# Patient Record
Sex: Female | Born: 1960 | Race: White | Hispanic: No | Marital: Married
Health system: Midwestern US, Community
[De-identification: ages and names within clinical notes are randomized; demographics above are authoritative.]

---

## 2017-07-15 ENCOUNTER — Encounter: Attending: Sports Medicine" | Primary: Family

## 2017-07-15 ENCOUNTER — Inpatient Hospital Stay: Payer: MEDICARE | Primary: Family

## 2017-07-15 ENCOUNTER — Ambulatory Visit: Admit: 2017-07-15 | Discharge: 2017-07-15 | Payer: MEDICARE | Attending: Sports Medicine" | Primary: Family

## 2017-07-15 DIAGNOSIS — M545 Low back pain, unspecified: Secondary | ICD-10-CM

## 2017-07-15 DIAGNOSIS — G35 Multiple sclerosis: Secondary | ICD-10-CM

## 2017-07-15 LAB — COMPREHENSIVE METABOLIC PANEL WITH BILIRUBIN
ALT: 14 U/L (ref 5–33)
AST: 19 U/L (ref <32)
Albumin/Globulin Ratio: 1.3 (ref 1.0–2.5)
Albumin: 4.2 g/dL (ref 3.5–5.2)
Alkaline Phosphatase: 61 U/L (ref 35–104)
Anion Gap: 13 mmol/L (ref 9–17)
BUN: 8 mg/dL (ref 6–20)
Bilirubin, Direct: <0.08 mg/dL (ref <0.31)
CO2: 26 mmol/L (ref 20–31)
Calcium: 10 mg/dL (ref 8.6–10.4)
Chloride: 100 mmol/L (ref 98–107)
Creatinine: 0.64 mg/dL (ref 0.50–0.90)
GFR African American: >60 mL/min (ref >60)
GFR Non-African American: >60 mL/min (ref >60)
Glucose: 91 mg/dL (ref 70–99)
Potassium: 4.7 mmol/L (ref 3.7–5.3)
Sodium: 139 mmol/L (ref 135–144)
Total Bilirubin: 0.26 mg/dL — ABNORMAL LOW (ref 0.3–1.2)
Total Protein: 7.5 g/dL (ref 6.4–8.3)

## 2017-07-15 LAB — CBC WITH AUTO DIFFERENTIAL
Absolute Eos #: 0.06 10*3/uL (ref 0.00–0.44)
Absolute Immature Granulocyte: <0.03 10*3/uL (ref 0.00–0.30)
Absolute Lymph #: 2.58 10*3/uL (ref 1.10–3.70)
Absolute Mono #: 0.35 10*3/uL (ref 0.10–1.20)
Basophils Absolute: 0.05 10*3/uL (ref 0.00–0.20)
Basophils: 1 % (ref 0–2)
Eosinophils %: 1 % (ref 1–4)
Hematocrit: 45.7 % (ref 36.3–47.1)
Hemoglobin: 14.6 g/dL (ref 11.9–15.1)
Immature Granulocytes: 0 %
Lymphocytes: 47 % — ABNORMAL HIGH (ref 24–43)
MCH: 29.3 pg (ref 25.2–33.5)
MCHC: 31.9 g/dL (ref 28.4–34.8)
MCV: 91.8 fL (ref 82.6–102.9)
MPV: 10.9 fL (ref 8.1–13.5)
Monocytes: 6 % (ref 3–12)
NRBC Automated: 0 per 100 WBC
Platelets: 313 10*3/uL (ref 138–453)
RBC: 4.98 m/uL (ref 3.95–5.11)
RDW: 12.6 % (ref 11.8–14.4)
Seg Neutrophils: 45 % (ref 36–65)
Segs Absolute: 2.46 10*3/uL (ref 1.50–8.10)
WBC: 5.5 10*3/uL (ref 3.5–11.3)

## 2017-07-15 LAB — LIPID PANEL
Chol/HDL Ratio: 3.2 (ref <5)
Cholesterol: 188 mg/dL (ref <200)
HDL: 58 mg/dL (ref >40)
LDL Cholesterol: 110 mg/dL (ref 0–130)
Triglycerides: 101 mg/dL (ref <150)

## 2017-07-15 LAB — TSH WITH REFLEX: TSH: 1.11 mIU/L (ref 0.30–5.00)

## 2017-07-15 NOTE — Progress Notes (Signed)
NEUROLOGY CONSULT    Patient Name:  Pam Harmon  DOB:   [birth year]  Clinic Visit Date: 07/15/2017    I saw Ms. Solon Palm  in the neurology clinic today.  57 year old lady with a known history of diagnosis of multiple sclerosis as a  teenager seen and evaluated by a number of physicians in this area as well as in [place] and [place].  According to her history she had been on Copaxone and other medications for multiple sclerosis in [place].  Apparently she did well, and later  Copaxone was discontinued after she moved to [place].  She has an established neurologist in  [place] who follows her for multiple sclerosis and also for symptoms of chronic lower back pain.  She says that she has severe persistent daily back pain which has been prescribed Percocet by her doctor in [place].She is is currently in this area for the summer and will be moving back to [place] in October.  She did not bring any old records for me to review.  According to history her doctor in [place] ordered an  MRI of the brain  last year which showed "only 2 lesions"..  Her main complaint today is persistent severe lower back pain which she requests pain management while she is in this area for the summer.  She was accompanied by a caregiver who gave most of the history on her behalf.H/O chronic tremors+       REVIEW OF SYSTEMS    Constitutional Weight changes: absent, change in appetite: absent Fatigue: absent;Fevers : absent, Any recent hospitalizations:  absent   HEENT Ears: normal,  Visual disturbance: absent   Reespiratory Shortness of breath: absent, choking:  absent, Cough: absent, Snoring : absent   Cardivascular Chest pain: absent, Leg swelling :absent, palpitations : absent, fainting : absent   GI Constipation: absent, Diarrhea: absent, Swallowing change: absent   GU Urinary frequency: absent, Urinary urgency: absent, Urinary incontinence: absent   Musculoskeletal Neck pain: absent, Back pain: present-low back  Stiffness: absent, Muscle pain: absent, Joint pain: absent, restless leg : present   Dermatological Hair loss: absent, Skin changes: absent   Neurological Confusion: absent, Trouble concentrating: absent, Seizures: absent;  Memory loss: absent, balance problem: present, Dizziness: absent, vertigo: absent, Weakness: present, Numbness present, Tremor: present, Spasm: absent, involuntary movement: absent, Speech difficulty: absent, Headache: absent, Light sensitivity: absent   Psychiatric Anxiety: absent, Depression  present, drug abuse: absent, Hallucination: absent, mood disorder: present, Suicidal ideations absent   Hematologic Abnormal bleeding: absent, Anemia: absent, Lymph gland changes: absent Clotting disorder: absent     Past Medical History:   Diagnosis Date   ??? Back pain    ??? Multiple sclerosis (HCC)        Past Surgical History:   Procedure Laterality Date   ??? BACK SURGERY     ??? BREAST REDUCTION SURGERY     ??? EYE SURGERY     ??? HYSTERECTOMY     ??? WRIST SURGERY Right        Social History     Socioeconomic History   ??? Marital status: Married     Spouse name: Not on file   ??? Number of children: Not on file   ??? Years of education: Not on file   ??? Highest education level: Not on file   Occupational History   ??? Not on file   Social Needs   ??? Financial resource strain: Not on file   ??? Food insecurity:  Worry: Not on file     Inability: Not on file   ??? Transportation needs:     Medical: Not on file     Non-medical: Not on file   Tobacco Use   ??? Smoking status: Never Smoker   ??? Smokeless tobacco: Never Used   Substance and Sexual Activity   ??? Alcohol use: Not Currently     Frequency: Never     Comment: no use since 11-23-1988   ??? Drug use: Not Currently     Types: Cocaine     Comment: quit 1990   ??? Sexual activity: Not on file   Lifestyle   ??? Physical activity:     Days per week: Not on file     Minutes per session: Not on file   ??? Stress: Not on file   Relationships   ??? Social connections:     Talks on phone: Not  on file     Gets together: Not on file     Attends religious service: Not on file     Active member of club or organization: Not on file     Attends meetings of clubs or organizations: Not on file     Relationship status: Not on file   ??? Intimate partner violence:     Fear of current or ex partner: Not on file     Emotionally abused: Not on file     Physically abused: Not on file     Forced sexual activity: Not on file   Other Topics Concern   ??? Not on file   Social History Narrative   ??? Not on file       Current Outpatient Medications   Medication Sig Dispense Refill   ??? vitamin C (ASCORBIC ACID) 500 MG tablet Take 500 mg by mouth daily     ??? Calcium Citrate-Vitamin D (CALCIUM CITRATE + D PO) Take by mouth     ??? alendronate (FOSAMAX) 70 MG tablet Take 70 mg by mouth every 7 days     ??? baclofen (LIORESAL) 10 MG tablet Take 10 mg by mouth nightly     ??? DULoxetine (CYMBALTA) 60 MG extended release capsule Take 60 mg by mouth daily     ??? folic acid (FOLVITE) 800 MCG tablet Take 800 mcg by mouth daily     ??? oxyCODONE-acetaminophen (PERCOCET) 10-325 MG per tablet Take 1 tablet by mouth 2 times daily.     ??? Cyanocobalamin (VITAMIN B-12) 5000 MCG SUBL Place under the tongue daily       No current facility-administered medications for this visit.         DATA:  No results found for: WBC, HGB, PLT, CHOL, TRIG, HDL, LDLDIRECT, ALT, AST, NA, K, CL, CREATININE, BUN, CO2, TSH, INR, GLUF, LABA1C, LABMICR    BP 106/67 (Site: Left Upper Arm, Position: Sitting, Cuff Size: Medium Adult)    Pulse 98    Temp 98.1 ??F (36.7 ??C) (Tympanic)    Resp 18    Ht 5' 4.5" (1.638 m)    Wt 172 lb (78 kg)    BMI 29.07 kg/m??     NEUROLOGICAL EXAMINATION:     MENTAL STATUS: Patient is alert and oriented.  There is no confusion or aphasia.  Memory is normal.     CRANIAL NERVES: Pupils are equal and reactive.  EOMS are equal in all directions.  There is no nystagmus or any other abnormal eye movements.  Facial sensation is normal.  There is no facial  weakness.  There is no loss of hearing.  Palate and tongue movements are normal.     MOTOR EXAMINATION: Muscle tone is normal in all the limbs.  There is no focal weakness.  Muscle strength is 5/5 in both upper and lower limbs.  Intermittent tremor is present in both upper extremities    SENSORY EXAMINATION: Normal.     STRETCH REFLEXES: 2+ and symmetrical in both the upper and lower limbs.      GAIT: There is no ataxia.        IMPRESSION:     1.  Chronic low back pain  and weakness in the lower limbs. R/O lumbar spinal stenosis   2.  History of diagnosis of Multiple Sclerosis. Now asymptomatic     PLAN:    1. MRI lumbar spine without contrast to determine the cause of her severe persistent low back pain  2. Agree with referral to pain management   3. Will consider MRI of the brain if she develops new / recurrent symptoms of Multiple Sclerosis  4. Follow up in this office as needed     NOTE: This neurology evaluation is part of outpatient coverage at [facility]  1-2 days per week.  Patients requiring frequent evaluations or uncomfortable with potential 3-4 day turnaround on questions or calls  may be better served by a neurologist in the area full time.  [facility] is available for outpatient visits and procedures including EMG/NCS.  Non-[facility] neurologists also practice in [place] (Dr. Wendelyn Breslow) and [facility] (Dr's Holland Falling, [place]).       Rozelle Logan, MD   07/15/2017  3:14 PM        CC: Sherron Flemings APRN-CNP

## 2017-07-15 NOTE — Patient Instructions (Signed)
SURVEY:    You may be receiving a survey from [employer] regarding your visit today.    Please complete the survey to enable us to provide the highest quality of care to you and your family.    If you cannot score us a very good on any question, please call the office to discuss how we could have made your experience a very good one.    Thank you.

## 2017-07-23 ENCOUNTER — Inpatient Hospital Stay: Admit: 2017-07-23 | Payer: MEDICARE | Primary: Family

## 2017-07-23 DIAGNOSIS — M545 Low back pain: Secondary | ICD-10-CM

## 2020-12-20 IMAGING — MR MRI BRAIN W/WO CONTRAST
10 of 12 series · 33 of 48 positions shown · IV contrast (prohance)
Comparison: None

INDICATION: 60-year-old female with multiple sclerosis.
TECHNIQUE: Multiplanar, multisequence MRI of the brain was performed without and with intravenous contrast. The patient received an intravenous dose of 15 mL ProHance. Multiple sclerosis protocol was used.

[Series 5: 3d_sag_flair · sagittal · 1.1mm · 1.22mm/px · 7 of 144 slices shown]
[im 1/144]
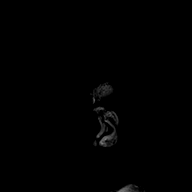
[im 24/144]
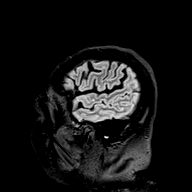
[im 48/144]
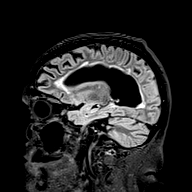
[im 72/144]
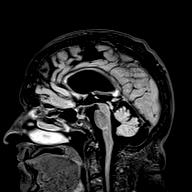
[im 96/144]
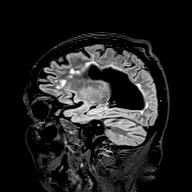
[im 120/144]
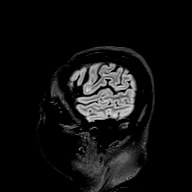
[im 144/144]
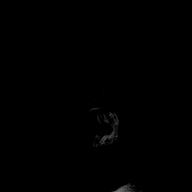

[Series 6: 3d_sag_flair_mpr_axial · axial · 1.1mm · 0.45mm/px · z∈[-113,+47]mm · 7 of 161 slices shown]
[im 1/161]
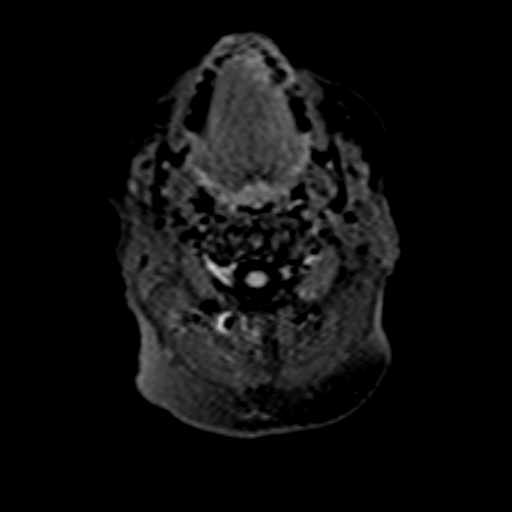
[im 27/161]
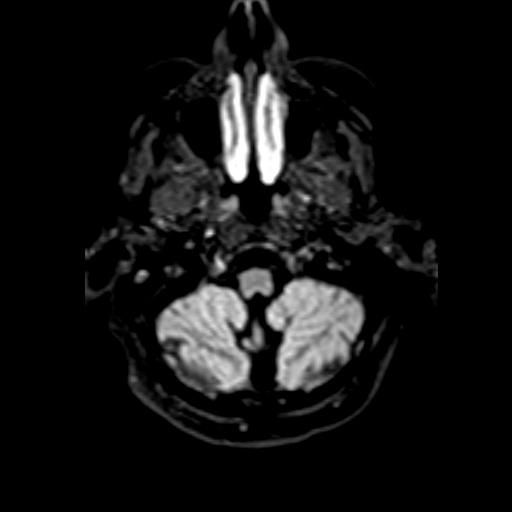
[im 54/161]
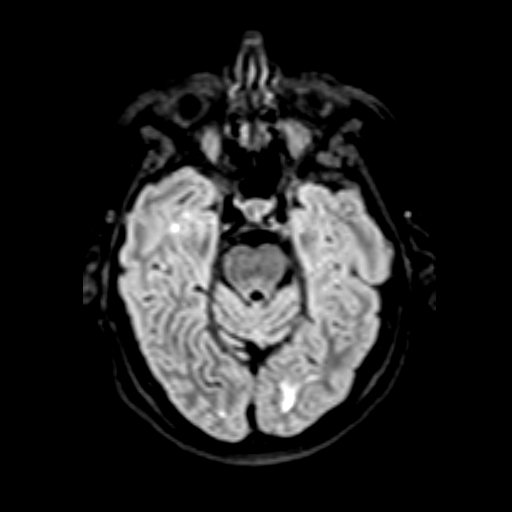
[im 81/161]
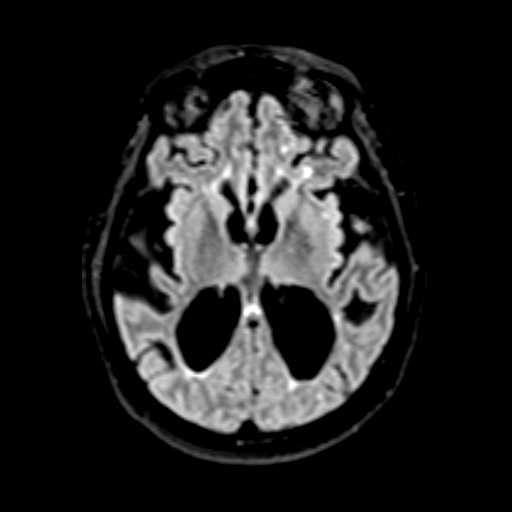
[im 107/161]
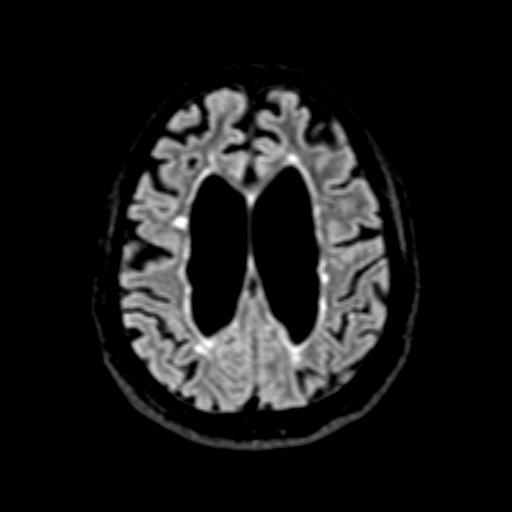
[im 134/161]
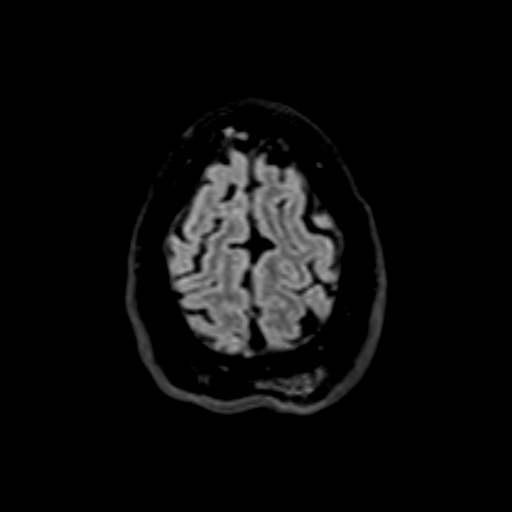
[im 161/161]
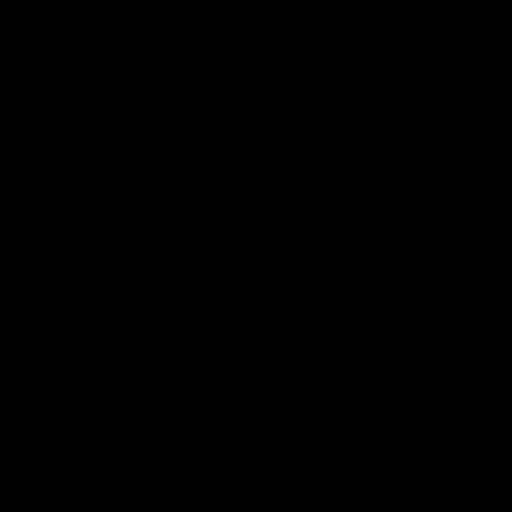

[Series 7: 3d_sag_flair_mpr_coronal · coronal · 1.1mm · 0.45mm/px · 7 of 187 slices shown]
[im 1/187]
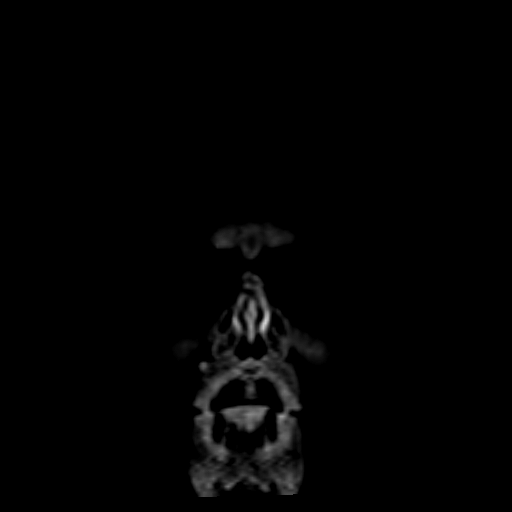
[im 32/187]
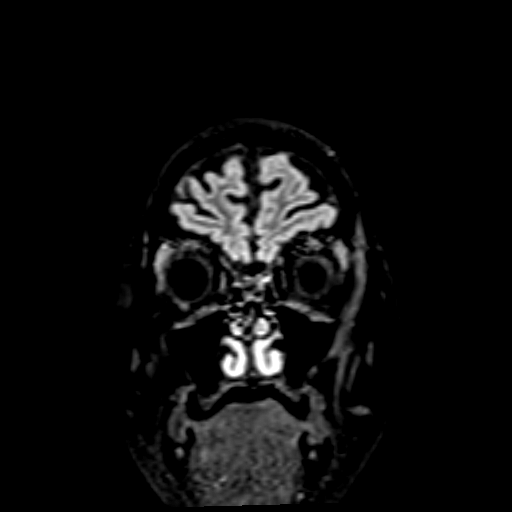
[im 63/187]
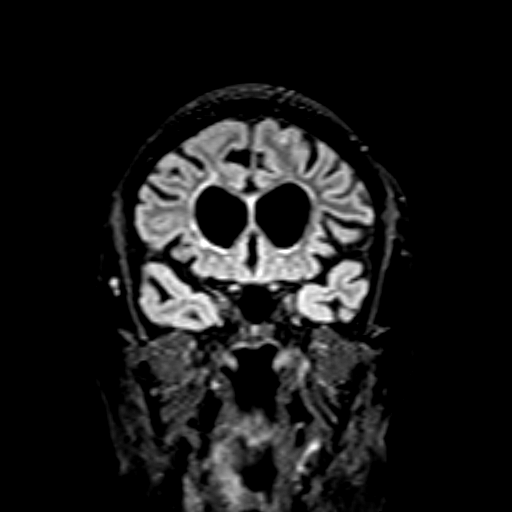
[im 94/187]
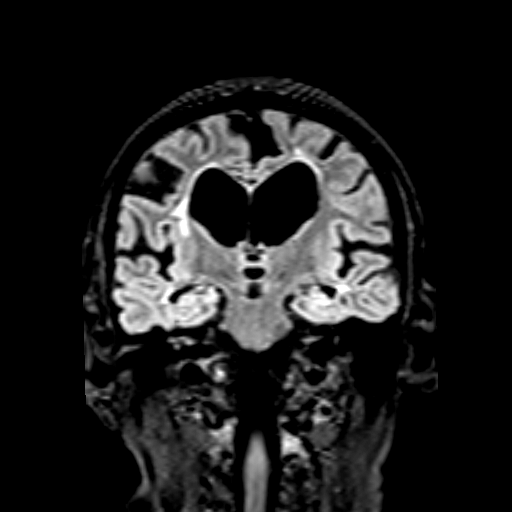
[im 125/187]
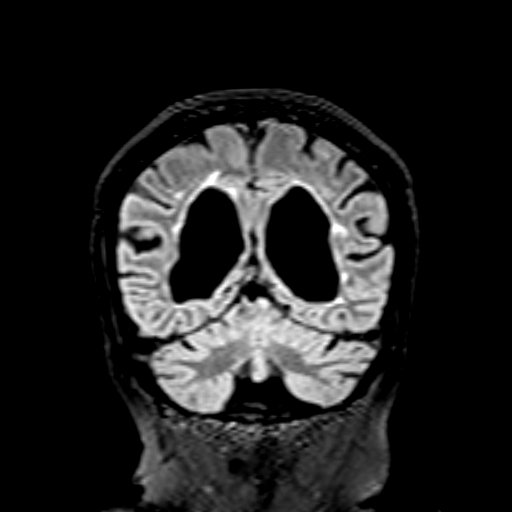
[im 156/187]
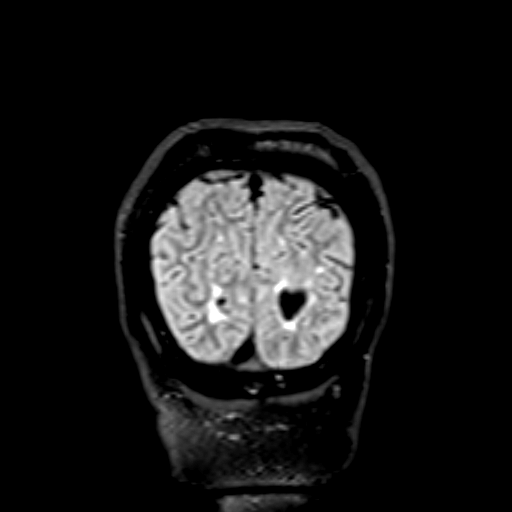
[im 187/187]
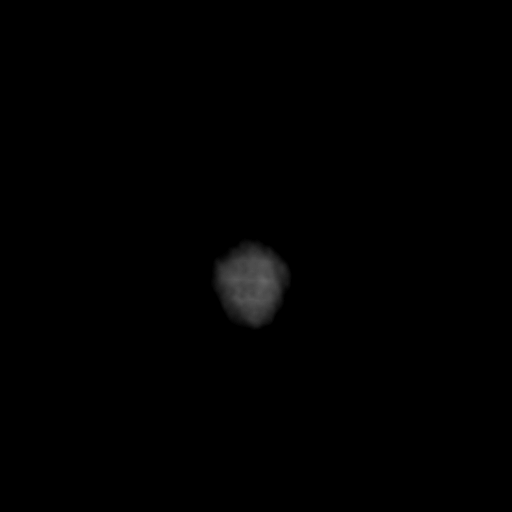

[Series 8: t2_axial · axial · 4.0mm · 0.72mm/px · 1 of 32 slices shown]
[im 1/32]
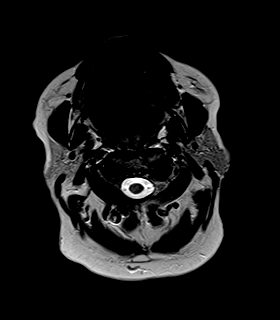

[Series 9: t1_axial · axial · 4.0mm · 0.85mm/px · 1 of 32 slices shown]
[im 1/32]
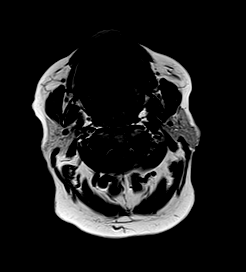

[Series 10: DWI · axial · 4.0mm · 0.65mm/px · 1 of 32 slices shown (1 of 3)]
[im 1/32]
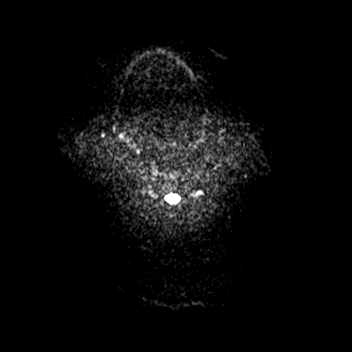

[Series 10: DWI · axial · 4.0mm · 0.65mm/px · 1 of 32 slices shown (2 of 3)]
[im 1/32]
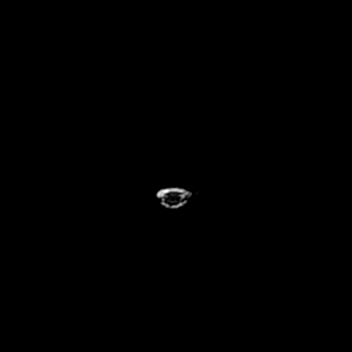

[Series 11: DWI · axial · 4.0mm · 0.65mm/px · 1 of 31 slices shown (3 of 3)]
[im 1/31]
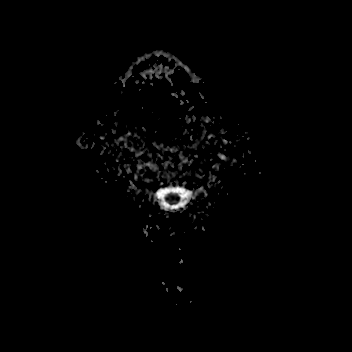

[Series 12: flash_axial · axial · 4.0mm · 0.90mm/px · 1 of 32 slices shown]
[im 1/32]
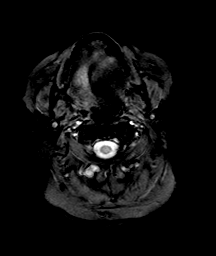

[Series 13: mprage_axial_fs_+c · axial · 0.9mm · 0.92mm/px · z∈[-116,+6]mm · 6 of 191 slices shown]
[im 1/191]
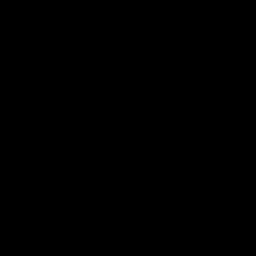
[im 28/191]
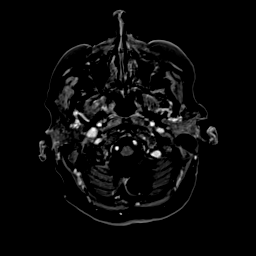
[im 55/191]
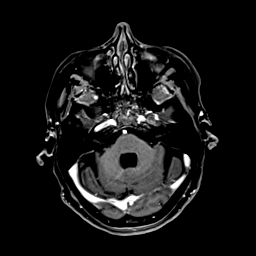
[im 82/191]
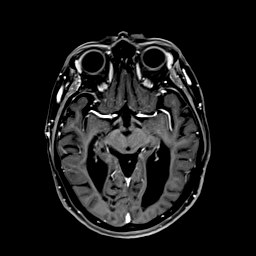
[im 109/191]
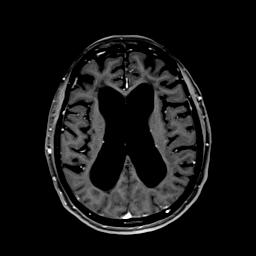
[im 136/191]
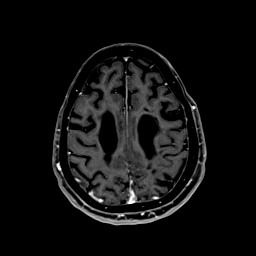

[33 of 48 positions shown; findings below may reference images not displayed]

FINDINGS: Brain parenchyma: No acute infarct or hemorrhage. Multiple juxtacortical, periventricular white matter and callosal T2/FLAIR hyperintense lesions, which may be seen with demyelinating disease. Likely superimposed chronic small vessel ischemic change. Moderate to severe diffuse parenchymal volume loss.

Ventricles: Ventriculomegaly secondary to parenchymal volume loss. No midline shift or herniation.

Extra-axial spaces: No extra-axial fluid collection. Mildly prominent left retrocerebellar CSF space, likely a small arachnoid cyst.

Extracranial structures: Minimal mucosal thickening in the bilateral ethmoid air cells. Trace fluid in the right mastoid air cells. Marrow signal normal. Orbits unremarkable. Soft tissues normal.

Contrast enhanced views are negative for pathologic enhancement.
IMPRESSION: 1.
Multiple supratentorial white matter lesions, which may be seen with demyelinating disease. No evidence of active demyelination.

2.
Moderate to severe parenchymal volume loss.

## 2020-12-20 IMAGING — MG MAMMO SCRN BIL W/CAD TOMO
8 series · 8 of 24 positions shown · non-contrast
Comparison: The present examination has been compared to prior imaging studies.

Images Obtained from [HOSPITAL] Imaging
INDICATION: Screening.
TECHNIQUE: Bilateral 2-D digital screening mammogram was performed followed by 3-D tomosynthesis.  Current study was also evaluated with a computer aided detection (CAD) system.
MAMMOGRAM FINDINGS:
The breasts are almost entirely fatty.
No suspicious abnormality is seen in either breast.

[L CC]
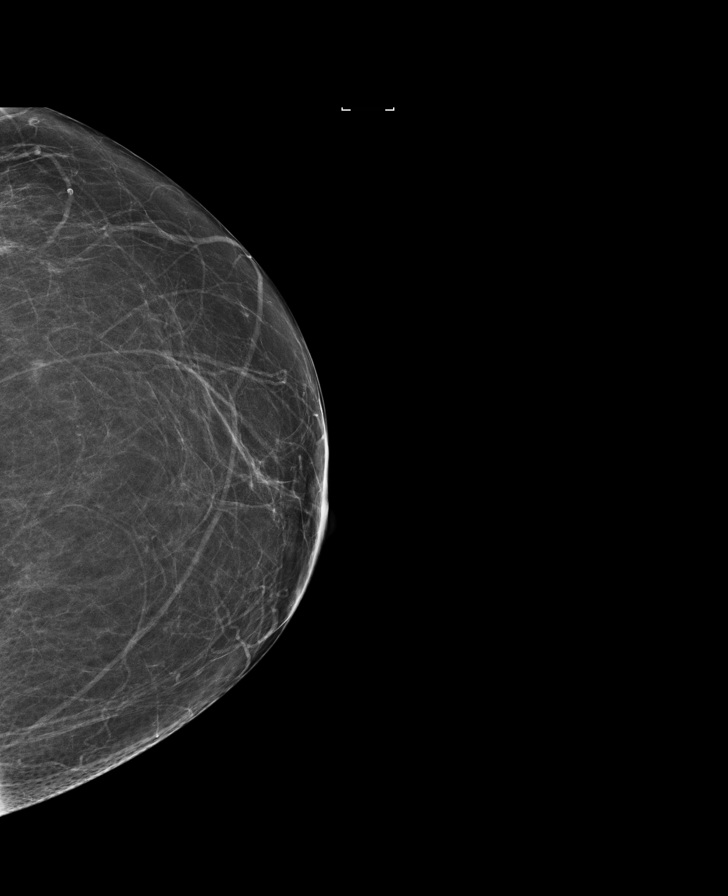

[R CC]
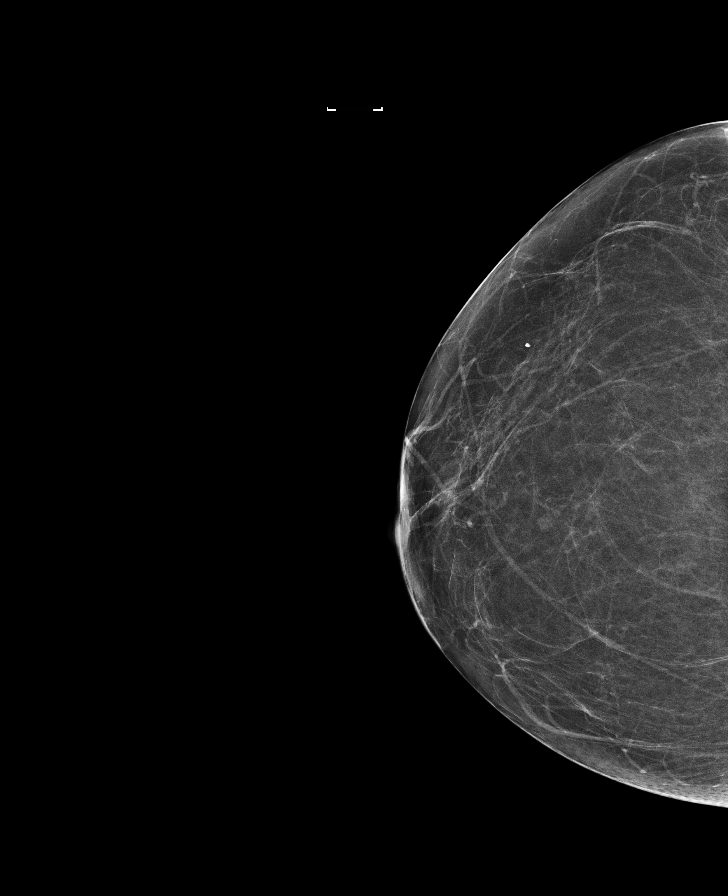

[L MLO]
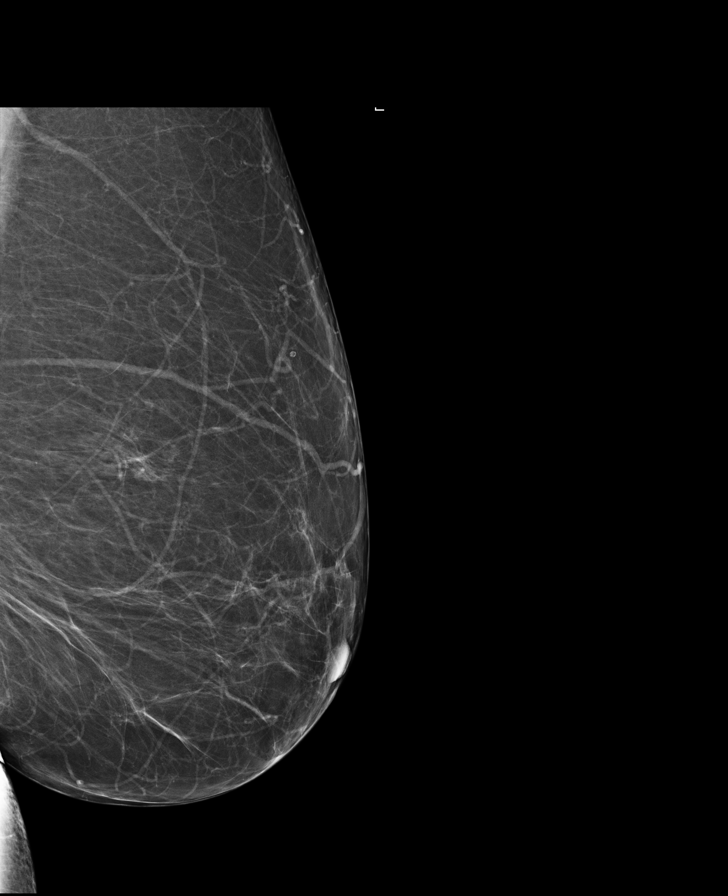

[R MLO]
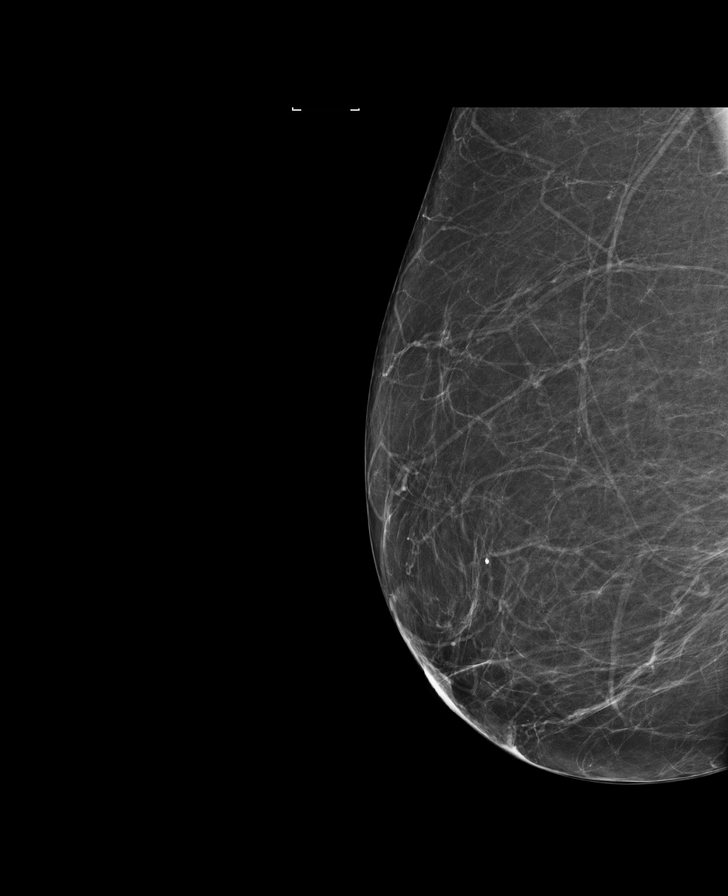

[R CC tomo · tomo slice 33/65.0]
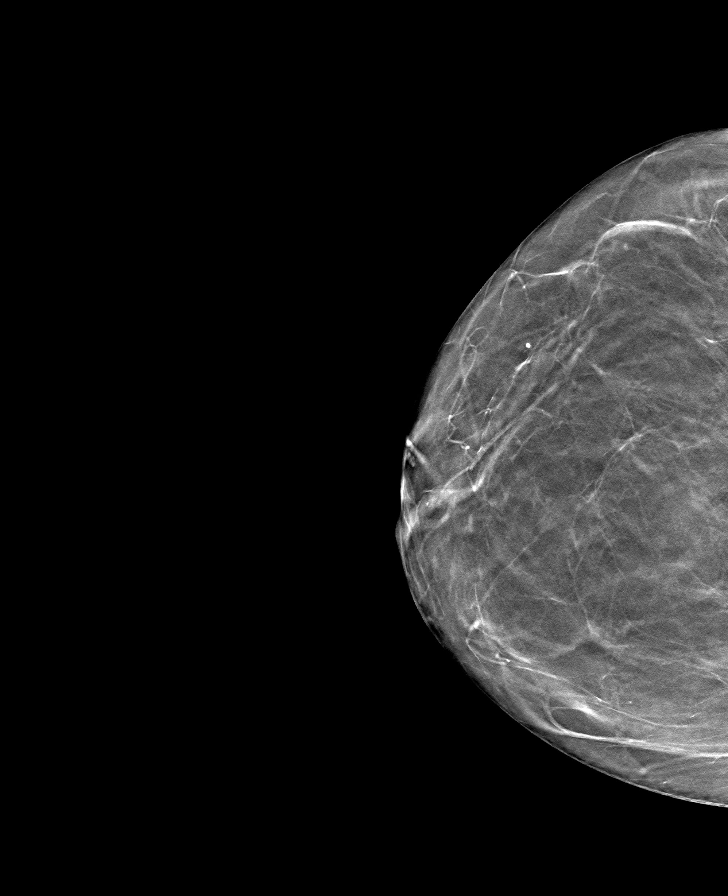

[L CC tomo · tomo slice 32/63.0]
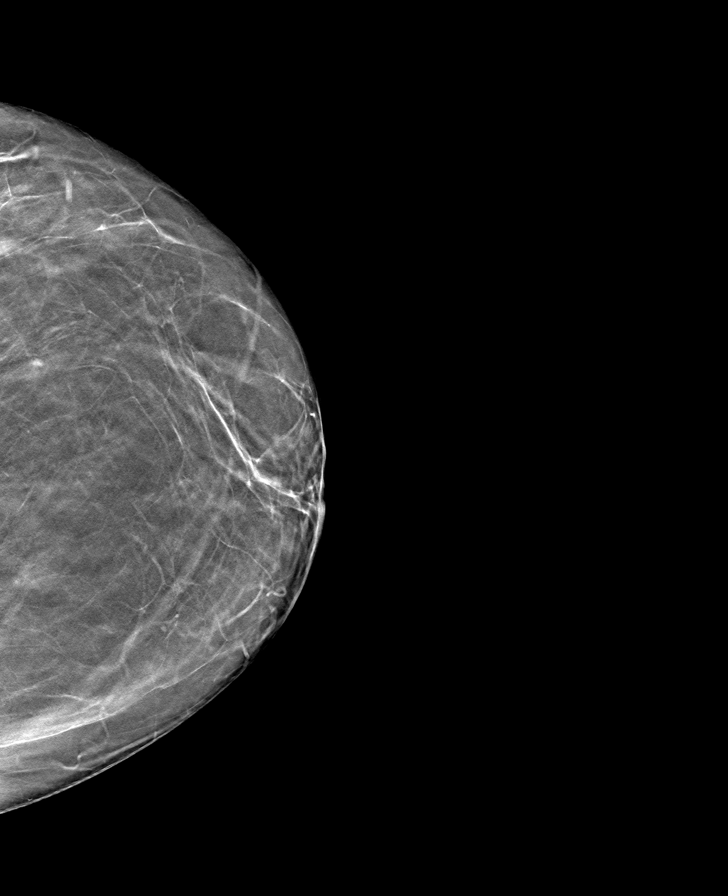

[L MLO tomo · tomo slice 39/77.0]
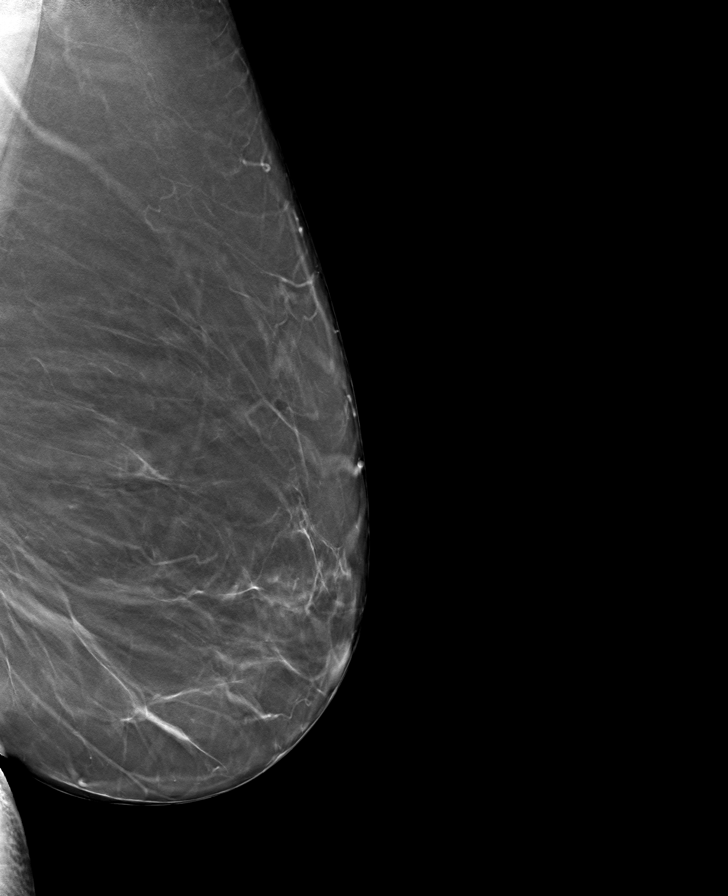

[R MLO tomo · tomo slice 35/70.0]
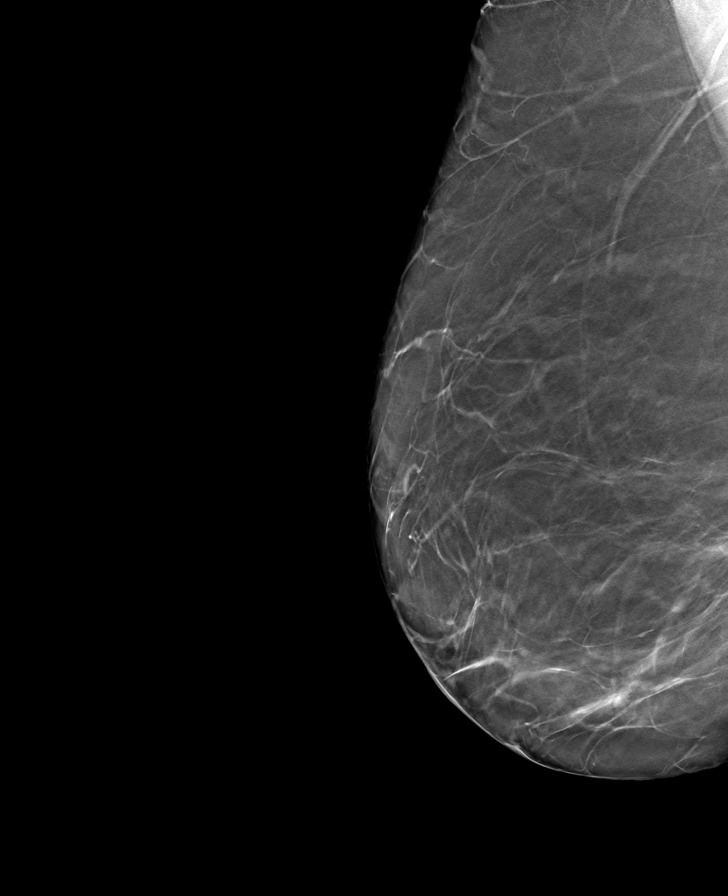

[8 of 24 positions shown; findings below may reference images not displayed]

IMPRESSION: There is no mammographic evidence of malignancy.
Screening mammogram recommended in 1 year.
BI-RADS Category 1: Negative

## 2020-12-27 IMAGING — MR MRI CSPINE WO/W CONTRAST
7 series · 46 of 48 positions shown · IV contrast (prohance)
Comparison: MRI brain 12/20/20

HISTORY: 60-year-old female with multiple sclerosis.
TECHNIQUE: Multiplanar, multisequential MRI of the cervical spine without and with intravenous contrast was performed. The patient received an intravenous dose of 15 mL ProHance.

[Series 18: t2_sag · sagittal · 3.0mm · 0.57mm/px · 6 of 15 slices shown]
[im 1/15]
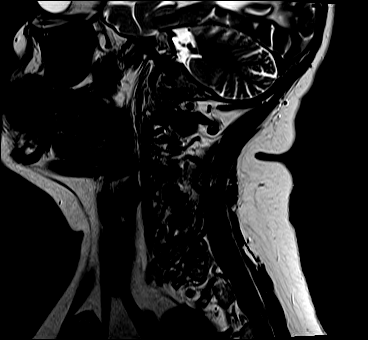
[im 3/15]
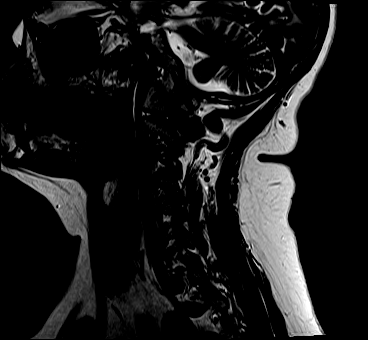
[im 6/15]
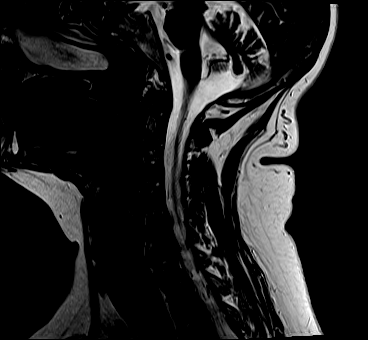
[im 9/15]
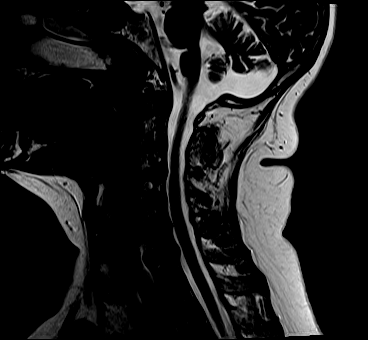
[im 12/15]
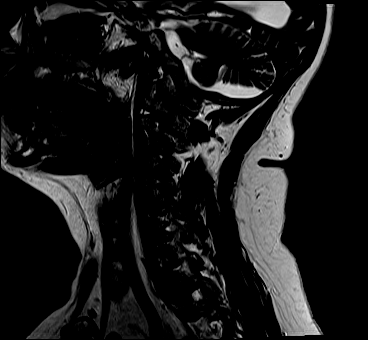
[im 15/15]
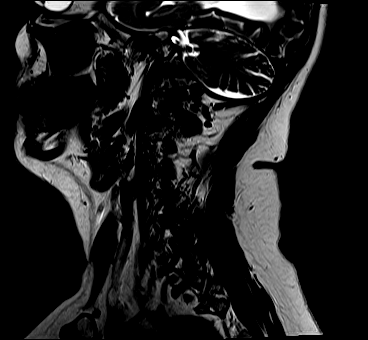

[Series 19: t1_sag · sagittal · 3.0mm · 0.62mm/px · 5 of 15 slices shown]
[im 1/15]
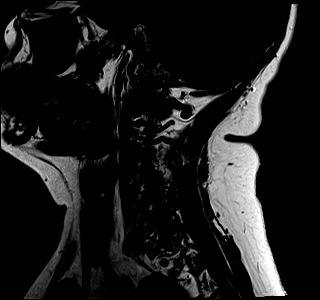
[im 4/15]
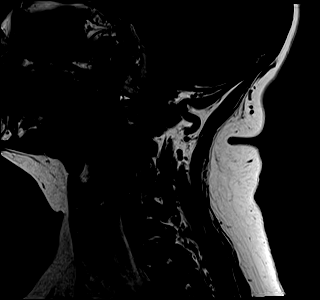
[im 8/15]
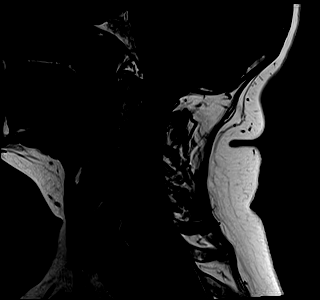
[im 11/15]
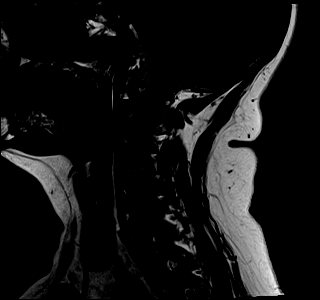
[im 15/15]
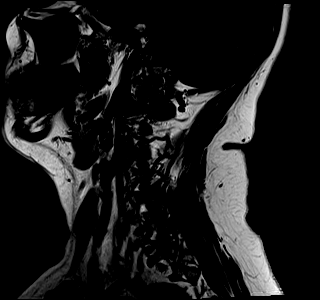

[Series 20: ir_sag · sagittal · 3.0mm · 0.78mm/px · 5 of 15 slices shown]
[im 1/15]
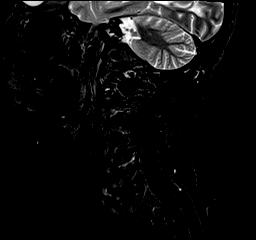
[im 4/15]
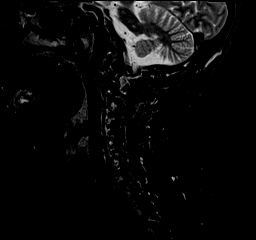
[im 8/15]
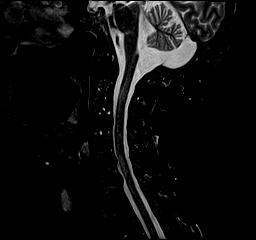
[im 11/15]
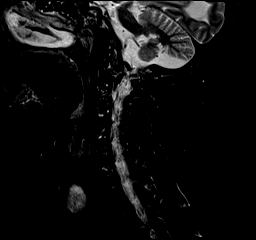
[im 15/15]
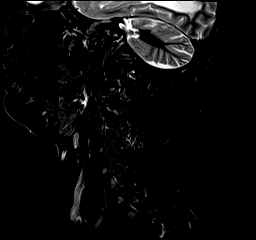

[Series 21: t2_medic_axial · axial · 3.0mm · 0.47mm/px · z∈[-137,-32]mm · 9 of 29 slices shown]
[im 1/29]
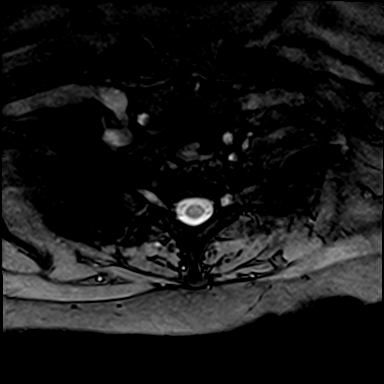
[im 4/29]
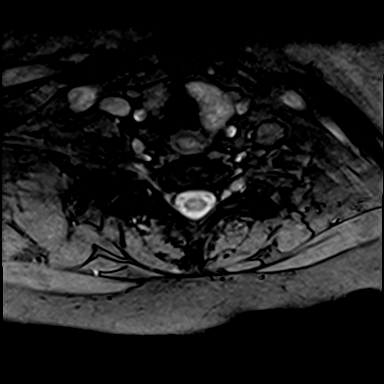
[im 8/29]
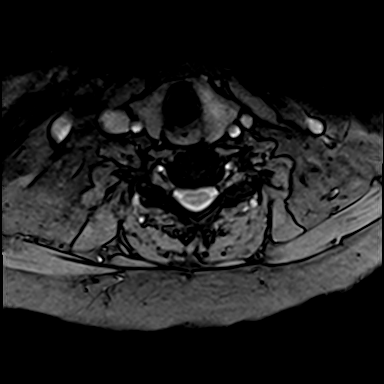
[im 11/29]
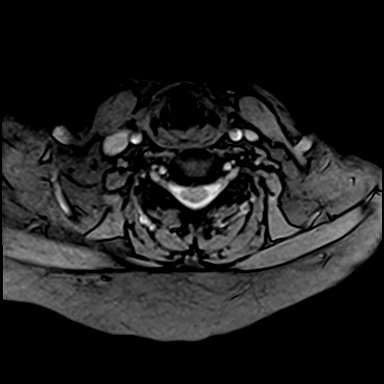
[im 15/29]
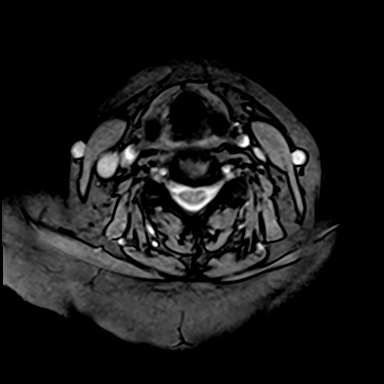
[im 18/29]
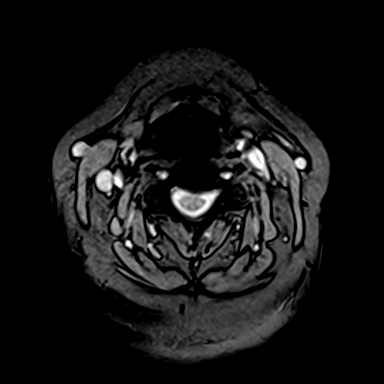
[im 22/29]
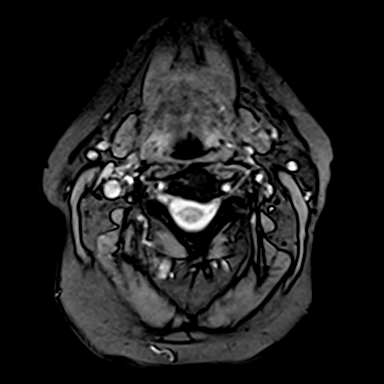
[im 25/29]
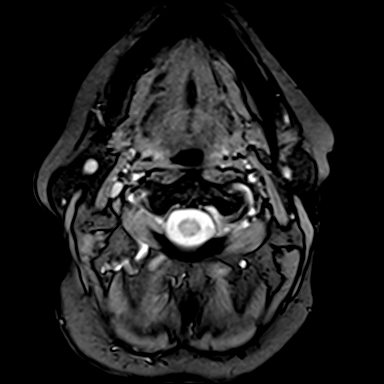
[im 29/29]
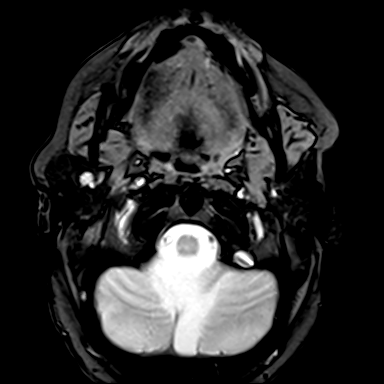

[Series 22: t1_axial_fs · axial · 3.0mm · 0.70mm/px · z∈[-137,-32]mm · 8 of 29 slices shown]
[im 1/29]
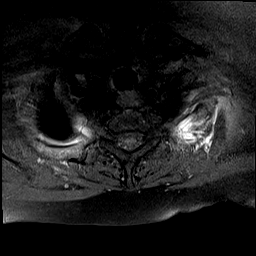
[im 4/29]
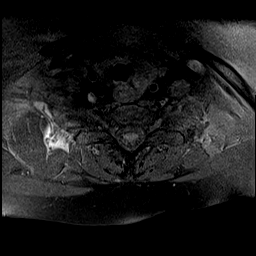
[im 8/29]
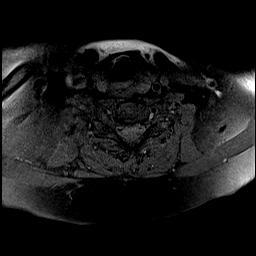
[im 11/29]
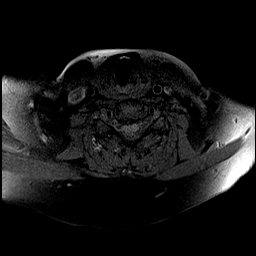
[im 18/29]
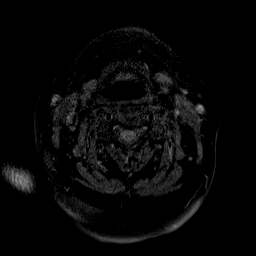
[im 22/29]
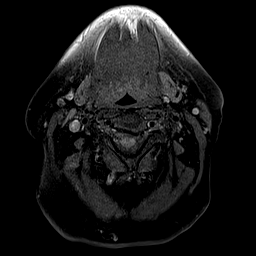
[im 25/29]
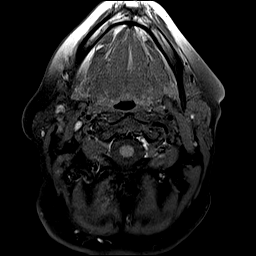
[im 29/29]
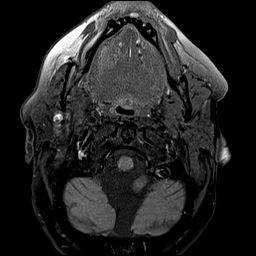

[Series 23: t1_axial_fs+c · axial · 3.0mm · 0.70mm/px · z∈[-137,-32]mm · 8 of 29 slices shown]
[im 1/29]
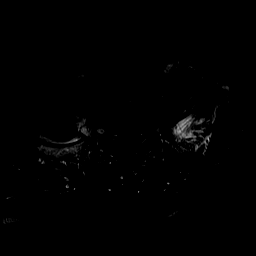
[im 4/29]
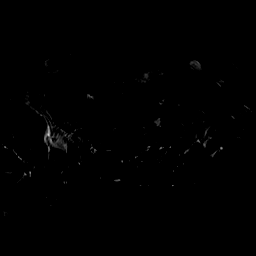
[im 8/29]
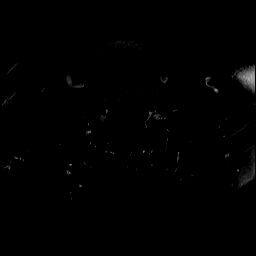
[im 11/29]
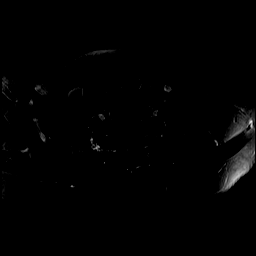
[im 18/29]
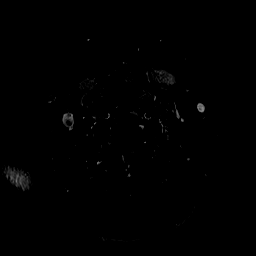
[im 22/29]
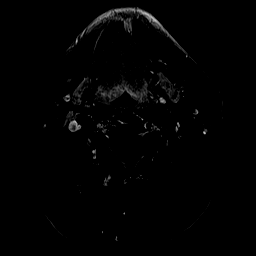
[im 25/29]
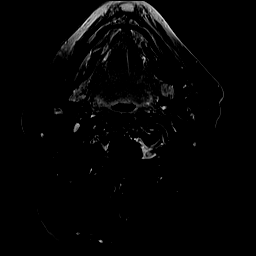
[im 29/29]
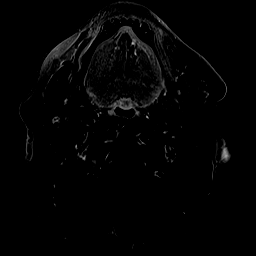

[Series 24: t1_sag_fs+c · sagittal · 3.0mm · 0.57mm/px · 5 of 15 slices shown]
[im 1/15]
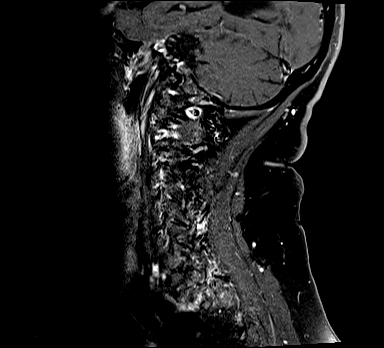
[im 4/15]
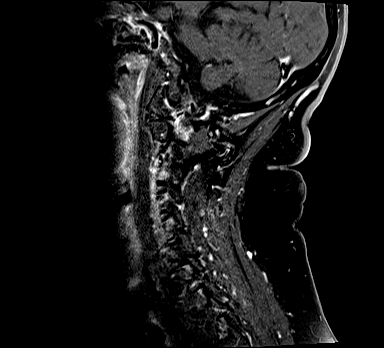
[im 8/15]
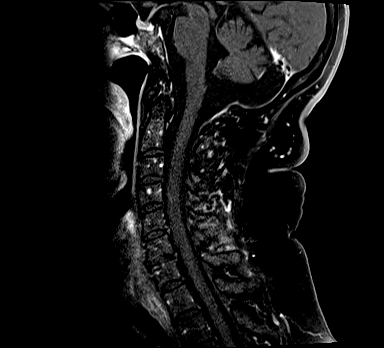
[im 11/15]
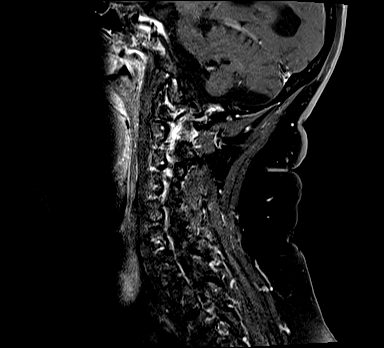
[im 15/15]
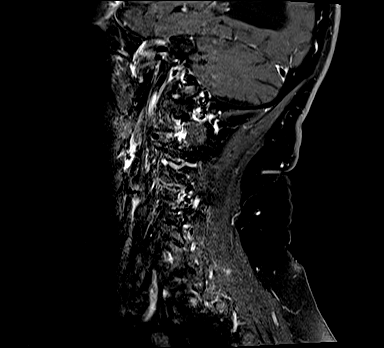

[46 of 48 positions shown; findings below may reference images not displayed]

FINDINGS: Alignment: No significant subluxation.  

Vertebrae and discs: Multilevel degenerative disc disease and mild discogenic endplate change, most pronounced at C6-C7. 

Marrow: No suspicious osseous lesion or acute fracture.

Cord: Questionable small hyperintense lesions in the right dorsal and midline dorsal cord at C4 ([DATE] and 14). No abnormal contrast enhancement. 

C2-3: No significant canal or neural foraminal stenosis.

C3-4: Mild disc osteophyte complex. Mild canal stenosis. No significant neural foraminal stenosis.

C4-5: No significant canal or neural foraminal stenosis.

C5-6: Mild disc osteophyte complex. Mild uncovertebral arthrosis. Mild canal stenosis. No significant neural foraminal stenosis.

C6-7: Mild disc osteophyte complex. Moderate right and mild left uncovertebral arthrosis. Mild to moderate canal stenosis. No significant neural foraminal stenosis.

C7-T1: No significant canal or neural foraminal stenosis.

Enhancing left thyroid nodule measuring up to 2.3 cm.
IMPRESSION: 1.
Questionable small hyperintense lesions in the cord at C4, possible demyelinating plaques. No evidence of active demyelination.

2.
Multilevel degenerative changes without high-grade canal or neural foraminal stenosis.

3.
Enhancing left thyroid nodule measuring up to 2.3 cm. This may be further characterized with thyroid ultrasound.

## 2020-12-27 IMAGING — MR MRI TSPINE WO/W CONTRAST
6 of 7 series · 29 of 48 positions shown · IV contrast (15cc Prohance)
Comparison: None

HISTORY: 60-year-old female with multiple sclerosis.
TECHNIQUE: Multiplanar, multisequential MR images of the thoracic spine were obtained without and with intravenous contrast. The patient received an intravenous dose of 15 mL ProHance.

[Series 18: t2_tse_sag · sagittal · 3.0mm · 0.83mm/px · 3 of 20 slices shown]
[im 1/20]
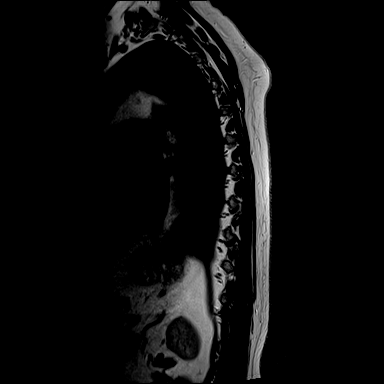
[im 10/20]
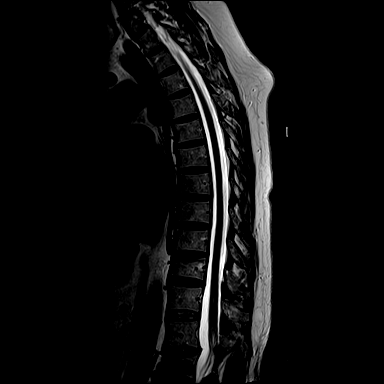
[im 20/20]
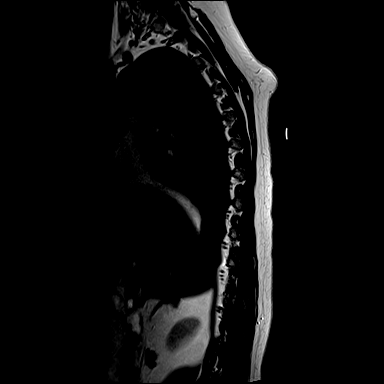

[Series 19: t1_sag · sagittal · 3.0mm · 0.83mm/px · 3 of 20 slices shown]
[im 1/20]
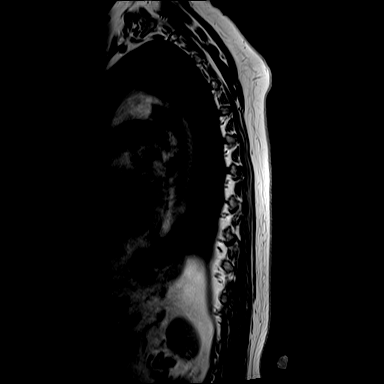
[im 10/20]
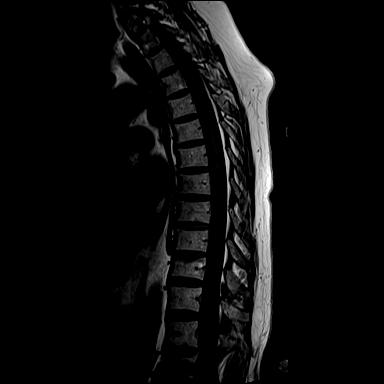
[im 20/20]
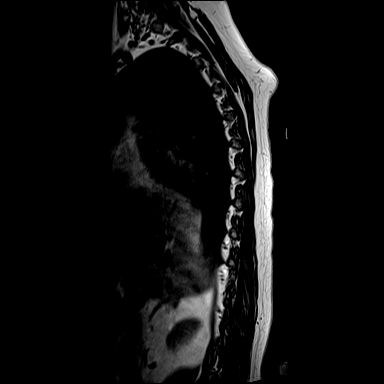

[Series 20: ir_sag · sagittal · 3.0mm · 0.62mm/px · 3 of 20 slices shown]
[im 1/20]
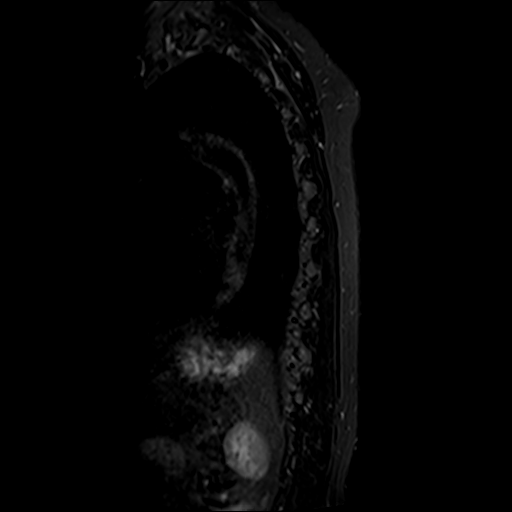
[im 10/20]
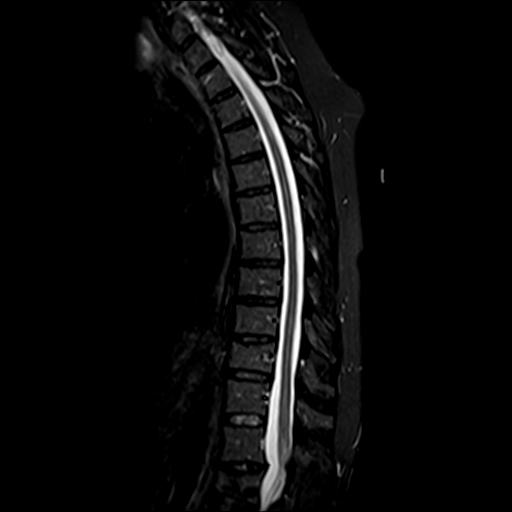
[im 20/20]
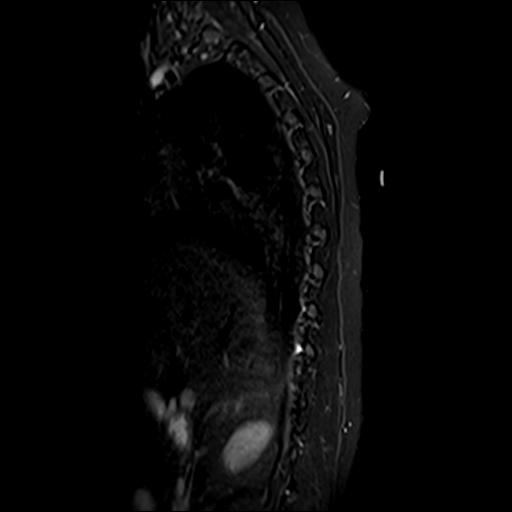

[Series 21: t2_axial · axial · 3.5mm · 0.62mm/px · z∈[-372,-100]mm · 9 of 70 slices shown]
[im 1/70]
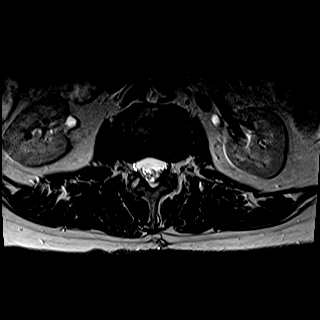
[im 13/70]
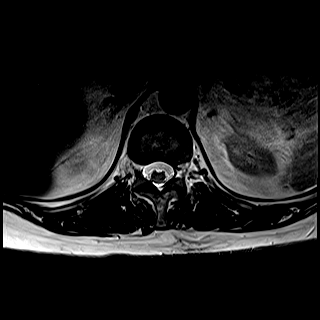
[im 19/70]
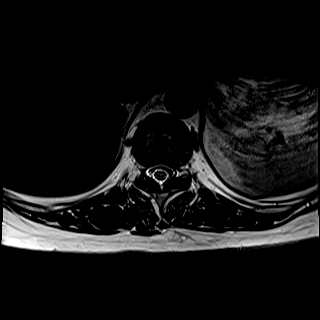
[im 32/70]
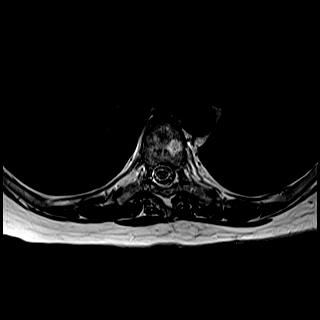
[im 38/70]
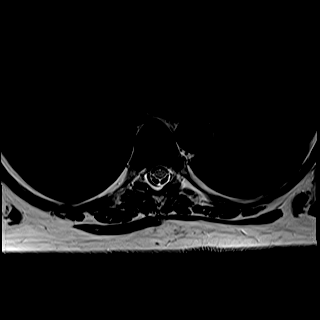
[im 51/70]
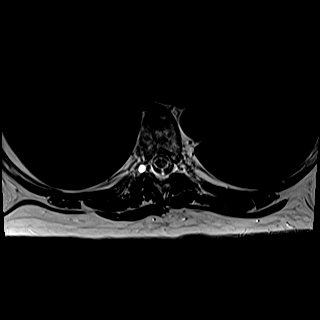
[im 57/70]
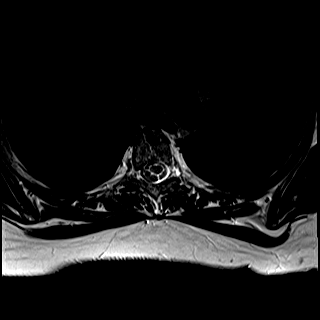
[im 63/70]
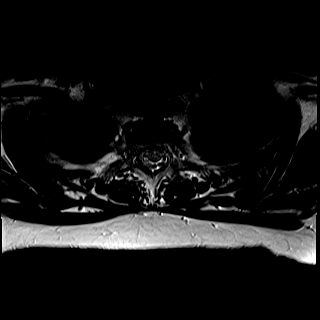
[im 70/70]
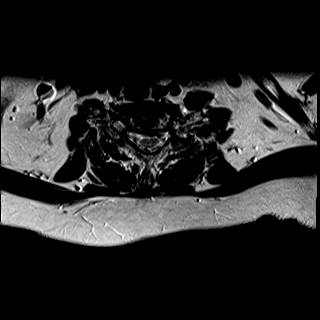

[Series 24: t1_axial_fs_pre · axial · 3.5mm · 0.39mm/px · z∈[-372,-100]mm · 8 of 70 slices shown]
[im 1/70]
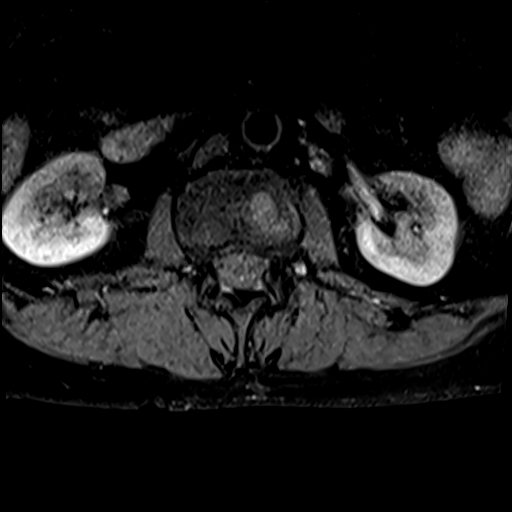
[im 13/70]
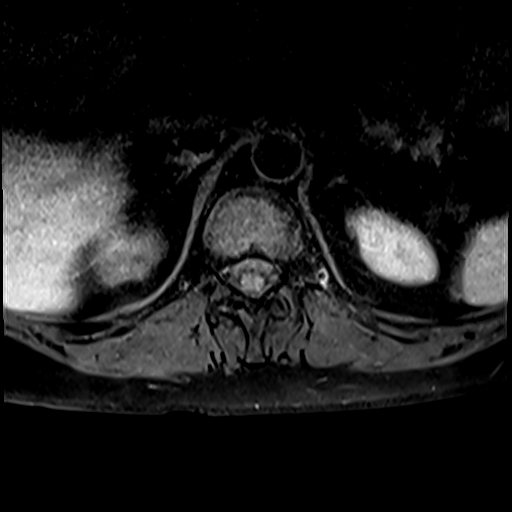
[im 19/70]
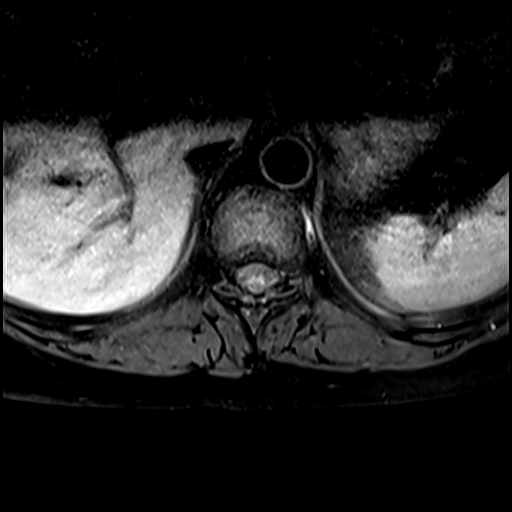
[im 32/70]
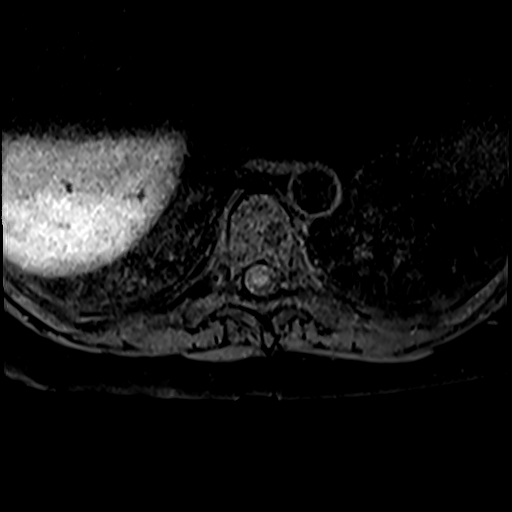
[im 38/70]
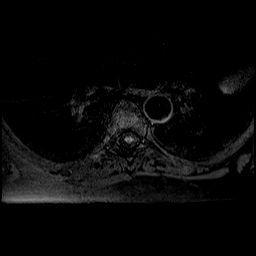
[im 51/70]
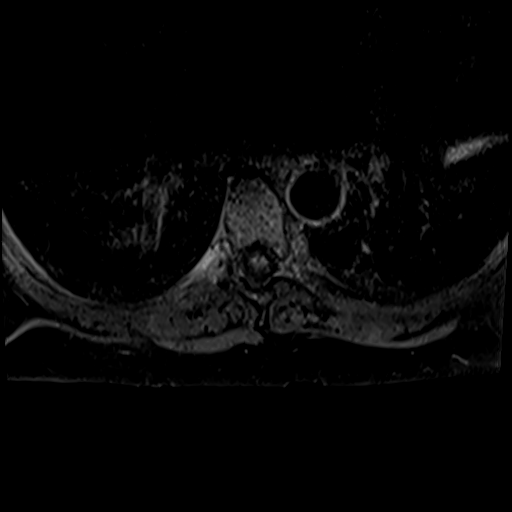
[im 57/70]
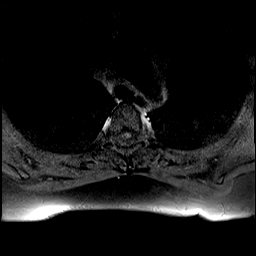
[im 70/70]
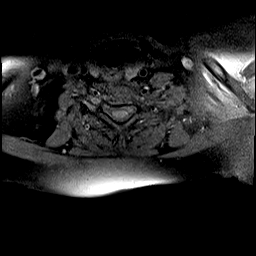

[Series 26: t1_axial_fs+c · axial · 3.5mm · 0.39mm/px · z∈[-372,-289]mm · 3 of 70 slices shown]
[im 1/70]
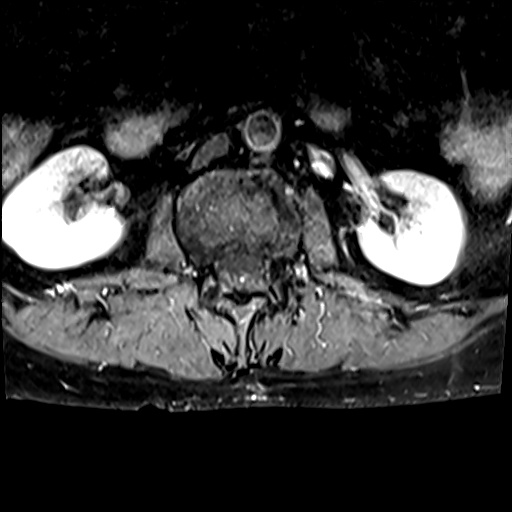
[im 13/70]
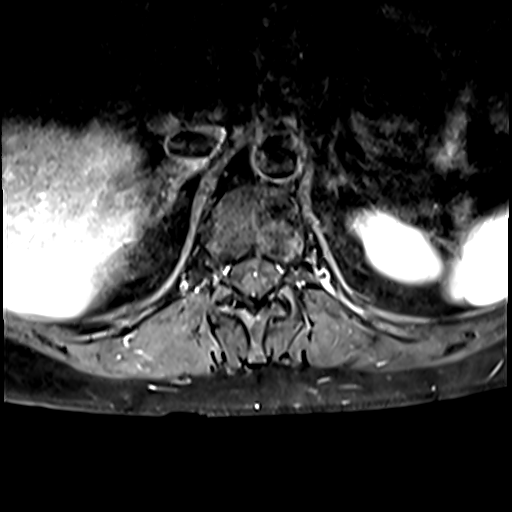
[im 19/70]
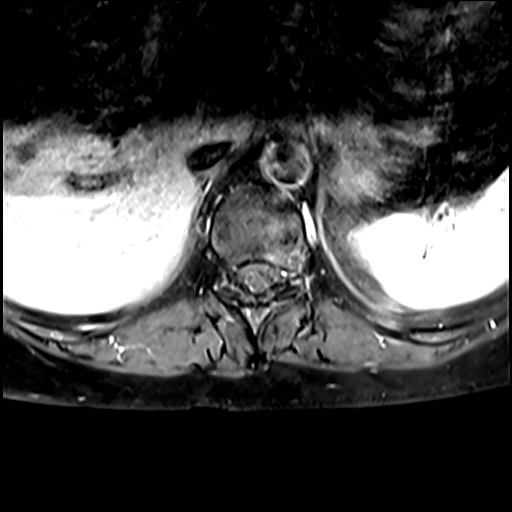

[29 of 48 positions shown; findings below may reference images not displayed]

FINDINGS: Alignment: Mild levo curvature with apex at T6.  

Vertebrae and discs: Chronic mild superior T10 compression deformity with 20% loss of height. Chronic L1 burst fracture with 40% loss of height and 3 mm retropulsion status post kyphoplasty changes. This results in mild canal stenosis at T12-L1. Mild multilevel degenerative disc disease.

Marrow: Vertebral hemangiomas at T7 and T8. No suspicious osseous lesion or acute fracture.

Cord: The spinal cord is normal in signal. 

Minimal canal stenosis at T10-T11 secondary to minimal disc bulge and mild ligamentum flavum thickening. No significant canal or neural foraminal stenosis.

Partly imaged left thyroid nodule.
IMPRESSION: 1.
No evidence of demyelination in the thoracic cord.

2.
Chronic T10 compression and L1 burst fractures. No acute abnormality.

3.
No high-grade canal or neural foraminal stenosis.

## 2021-01-08 IMAGING — US US THYROID
1 series · 14 of 25 positions shown · non-contrast
Comparison: None

REASON FOR EXAM: Thyroid nodule

[Series 1: us thyroid · 30 acquisitions, 14 frames shown]
[im 1/30]
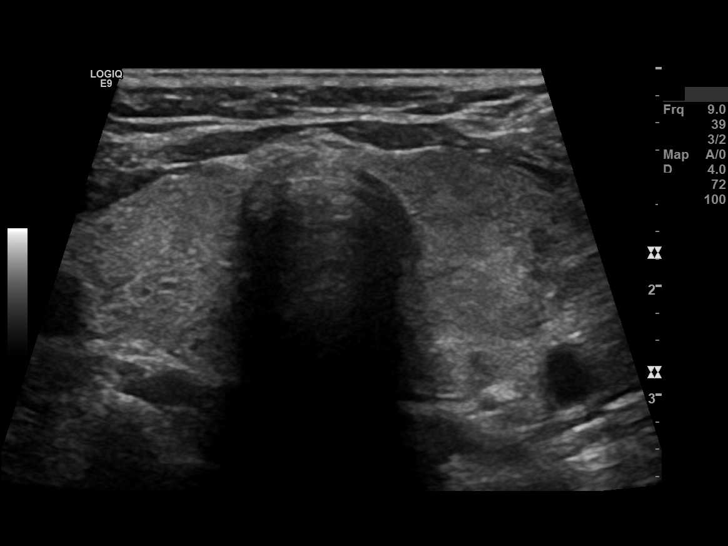
[im 3/30]
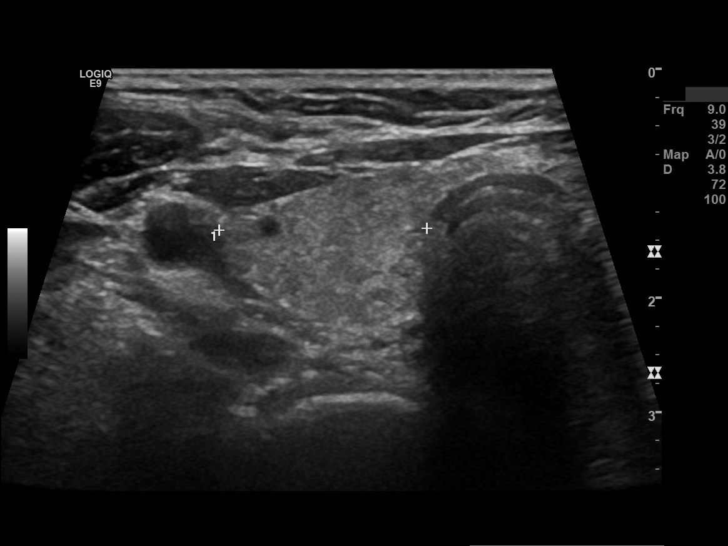
[im 5/30]
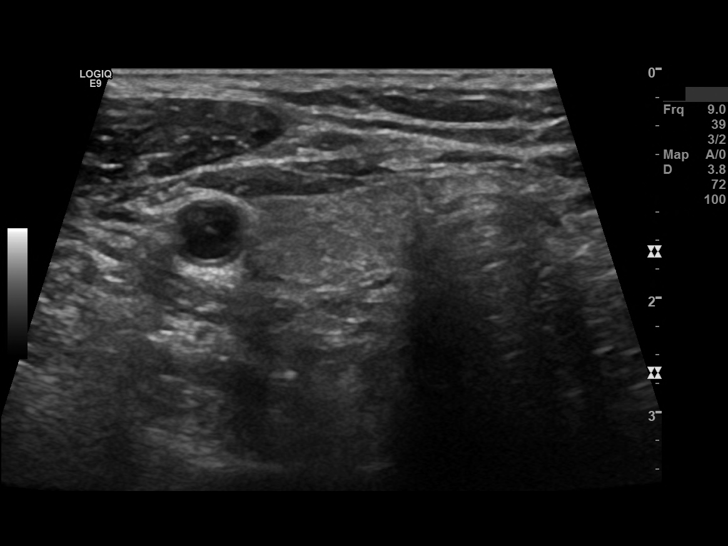
[im 8/30]
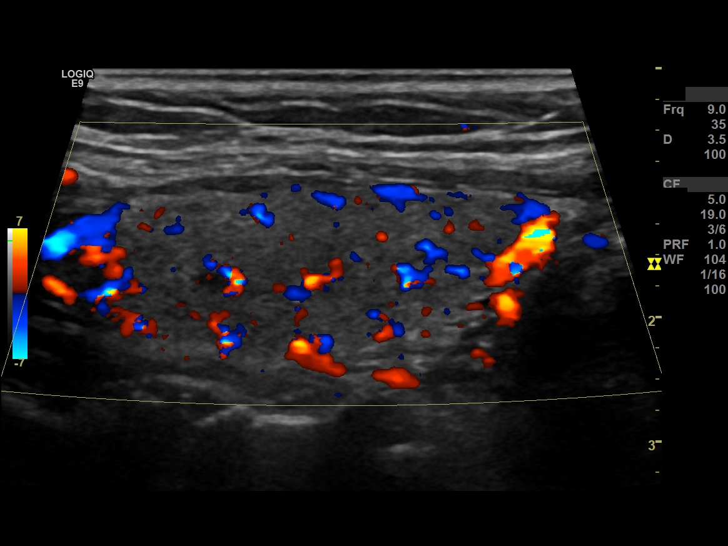
[im 10/30]
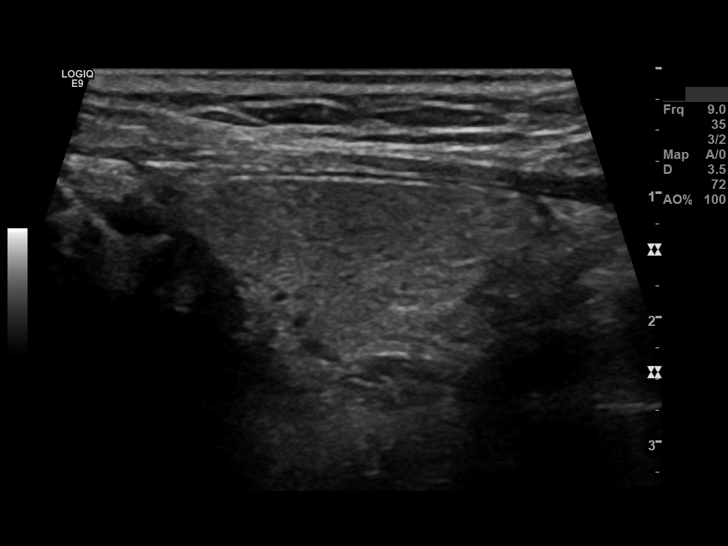
[im 11/30]
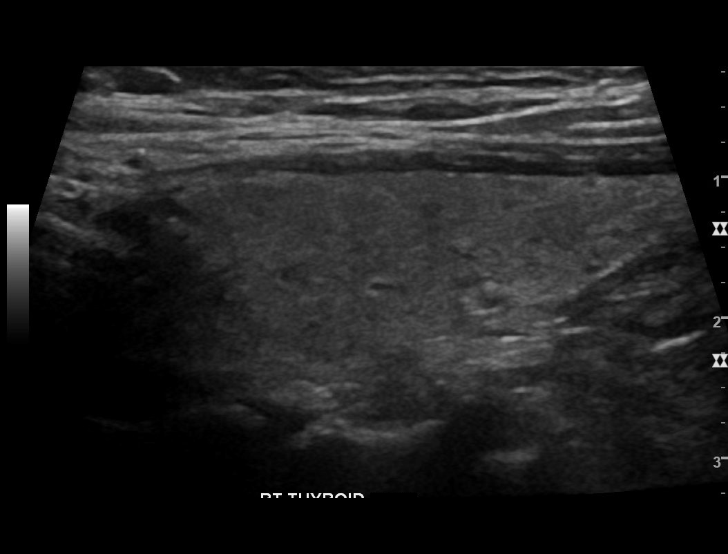
[im 14/30]
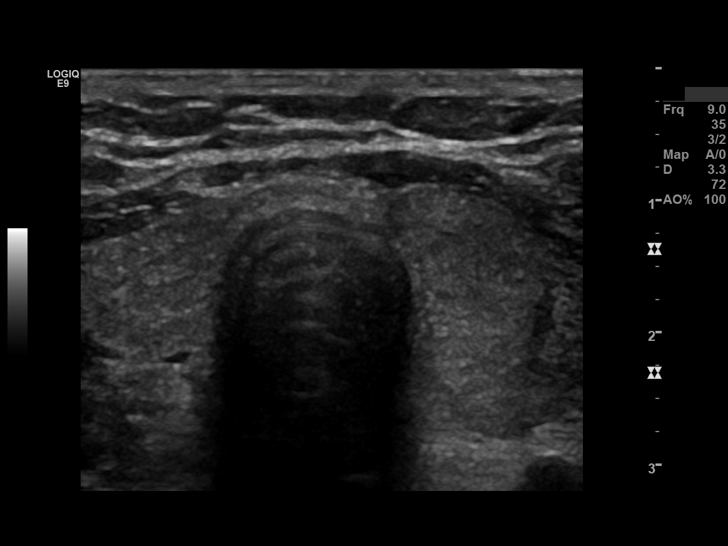
[im 16/30]
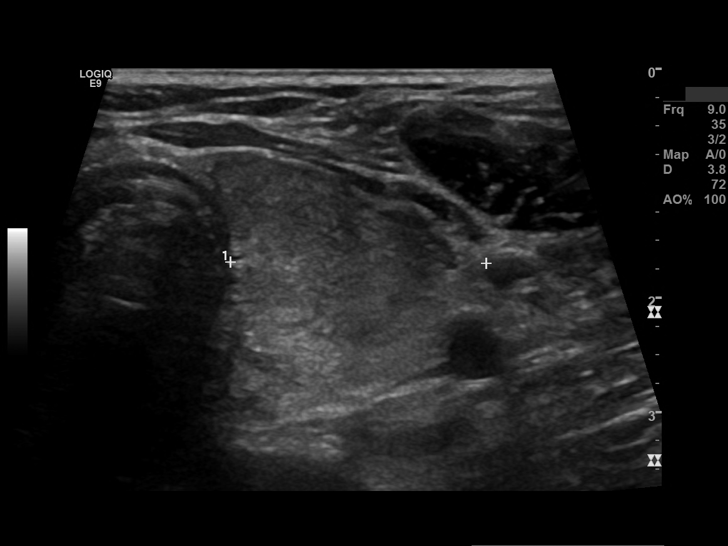
[im 19/30]
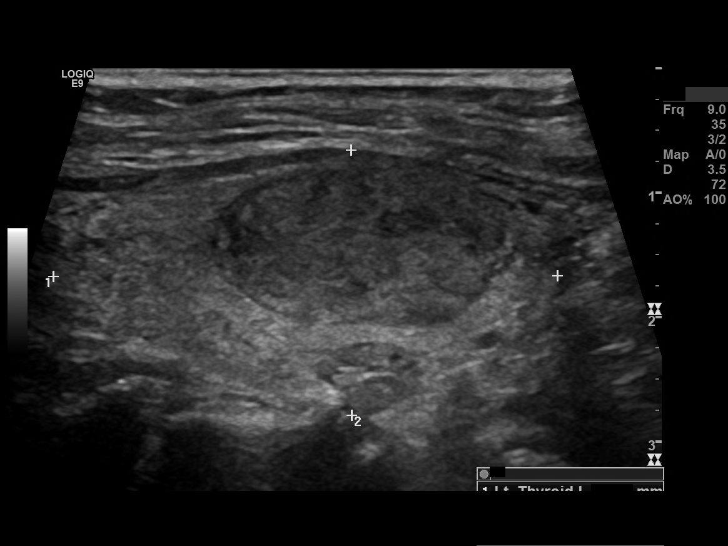
[im 20/30]
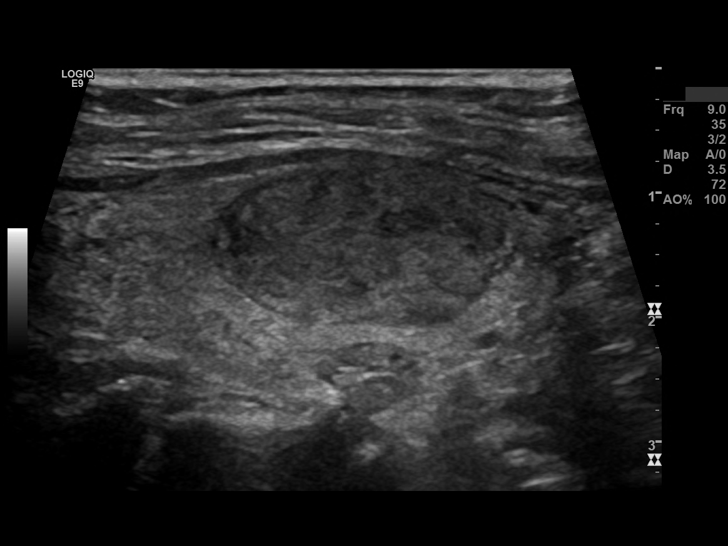
[im 22/30]
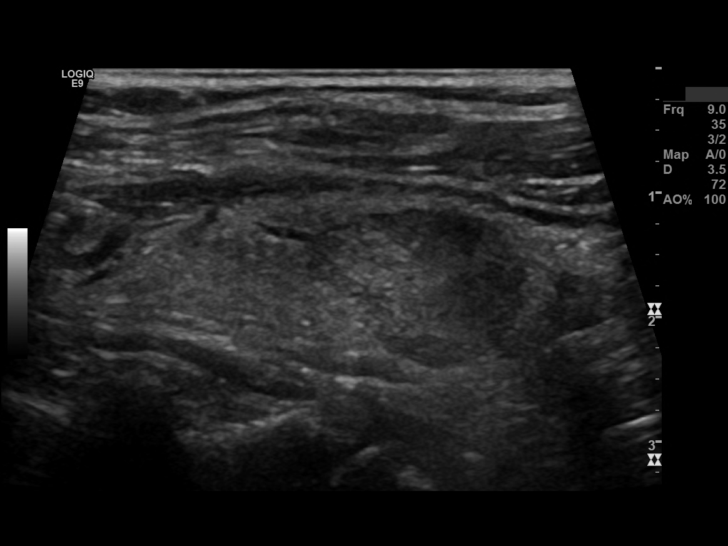
[im 25/30]
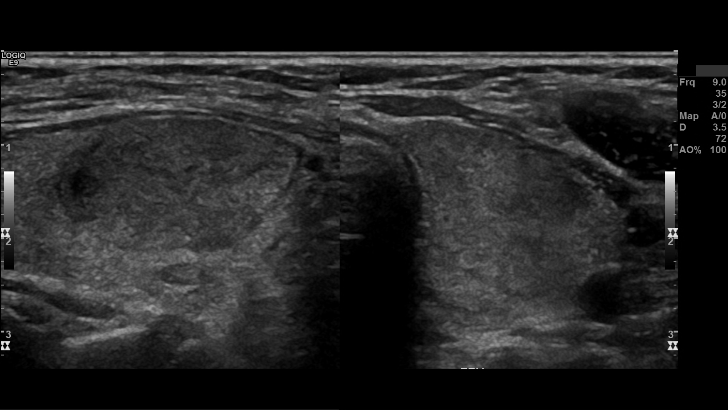
[im 27/30]
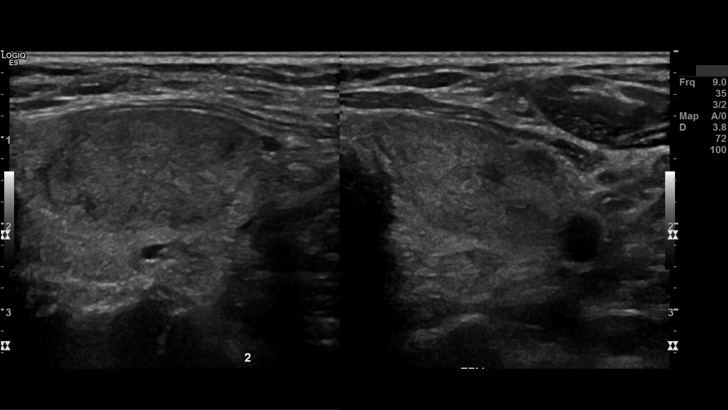
[im 30/30]
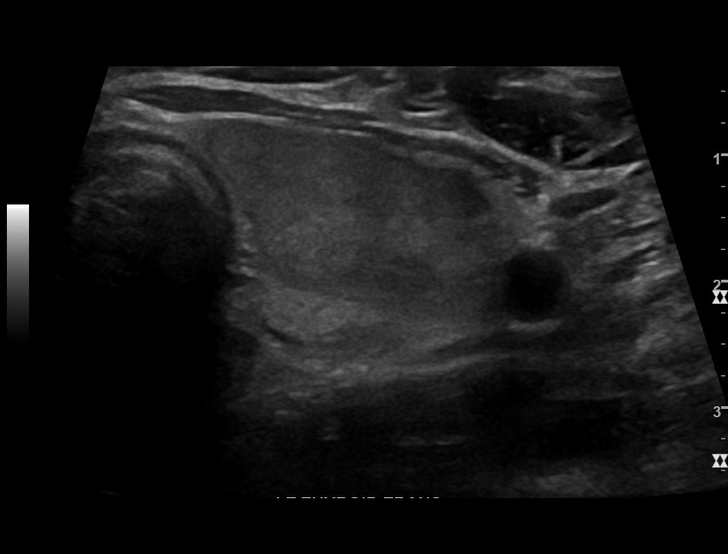

[14 of 25 positions shown; findings below may reference images not displayed]

TECHNIQUE AND FINDINGS:  Multiple ultrasound images of the thyroid gland were obtained. The right lobe of the thyroid measures 4.2 x 1.4 x 1.8 cm and has a normal echotexture without focal lesion.

The thyroid isthmus measures 3 mm thickness.

The left lobe of the thyroid measures 4.0 x 2.1 x 2.2 cm. An oval mildly hypoechoic solid nodule inferiorly measures 25 x 20 x 19 mm. A second posterior hypoechoic nodule measures 10 x 8 x 6 mm.
IMPRESSION: 1. TR 4 left thyroid nodule. Tissue sampling is recommended.

2. A much smaller TR 3 left thyroid nodule is present and annual follow-up is recommended.

ACR TI-RADS recommendations:

TR5 (>= 7 points) - FNA if > 1 cm, follow up if 0.5-0.9 cm every year for 5 years

TR4 (4-6 points) - FNA if >=1.5 cm, follow up if 1-1.4 cm in 1, 2, 3, and 5 years

TR3 (3 points) - FNA if 2.5 cm, follow up if 1.5-2.4 cm in 1, 3, and 5 years

TR2  (2 points) & TR1 (0 points) - No FNA or follow up.

Mazaveckas, Vitorcruz, et al.  ACR Thyroid Imaging, Reporting and Data System (TI-RADS):  White Paper of the ACR TI-RADS Committee. J Am Coll Radiology 4804, 14(5) 587-595.

## 2021-01-16 IMAGING — MR MRI LSPINE WO CONTRAST
6 series · 42 of 48 positions shown · non-contrast
Comparison: None.

INDICATION: Lumbar radiculopathy.
TECHNIQUE: Multiplanar, multiecho MR imaging of the lumbar spine was performed, including T1-weighted and fluid sensitive sequences without intravenous contrast.

[Series 11: iii_aaspine_lspine_mpr_cor · coronal · 1.7mm · 1.67mm/px · 15 of 80 slices shown]
[im 1/80]
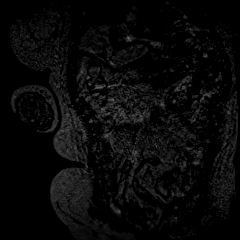
[im 5/80]
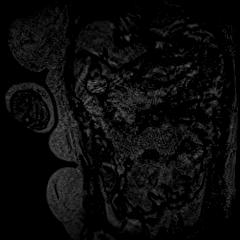
[im 9/80]
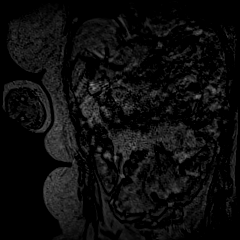
[im 14/80]
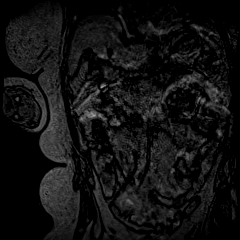
[im 18/80]
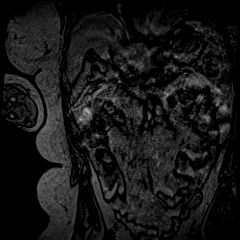
[im 22/80]
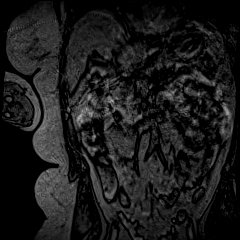
[im 27/80]
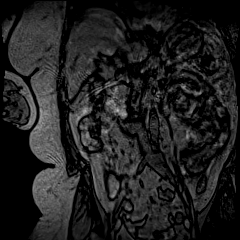
[im 31/80]
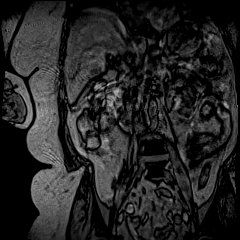
[im 36/80]
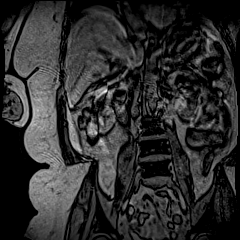
[im 40/80]
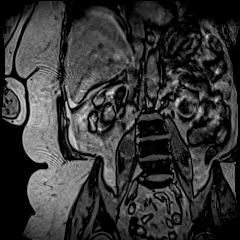
[im 44/80]
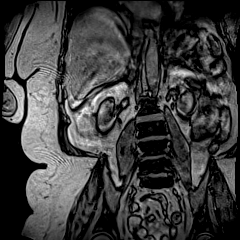
[im 49/80]
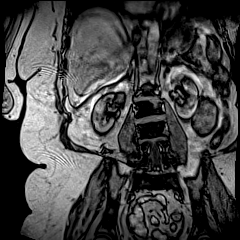
[im 58/80]
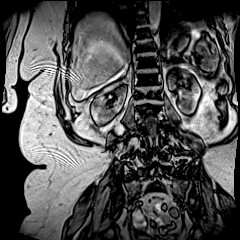
[im 66/80]
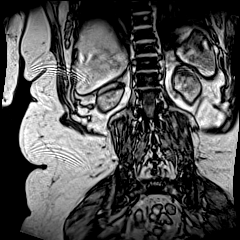
[im 75/80]
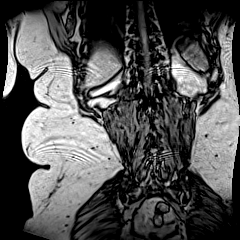

[Series 18: t2_sag · sagittal · 4.0mm · 0.68mm/px · 4 of 15 slices shown]
[im 1/15]
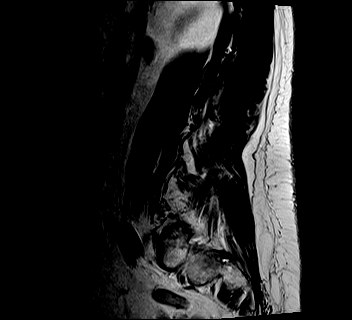
[im 5/15]
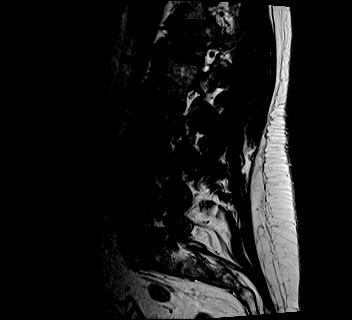
[im 10/15]
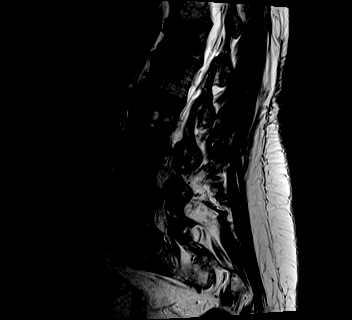
[im 15/15]
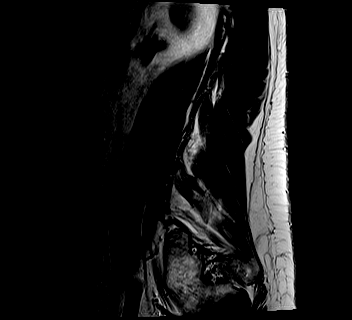

[Series 19: t1_sag · sagittal · 4.0mm · 0.75mm/px · 4 of 15 slices shown]
[im 1/15]
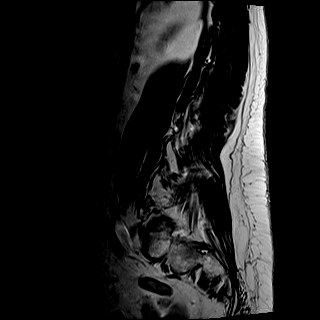
[im 5/15]
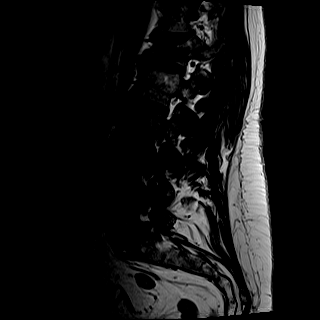
[im 10/15]
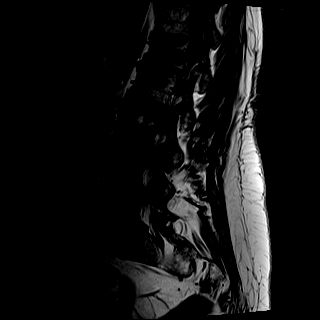
[im 15/15]
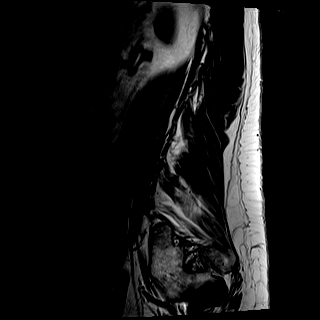

[Series 20: ir_sag · sagittal · 4.0mm · 0.94mm/px · 4 of 15 slices shown]
[im 1/15]
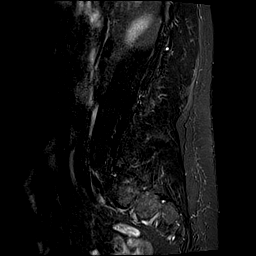
[im 5/15]
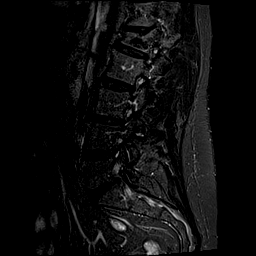
[im 10/15]
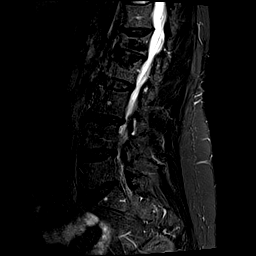
[im 15/15]
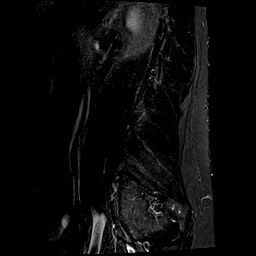

[Series 21: t2_axial · axial · 4.0mm · 0.62mm/px · z∈[-541,-356]mm · 8 of 40 slices shown]
[im 1/40]
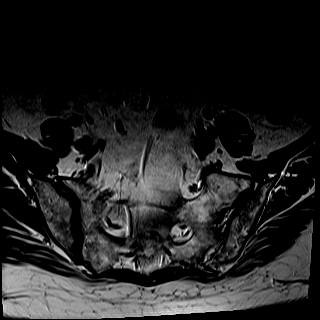
[im 5/40]
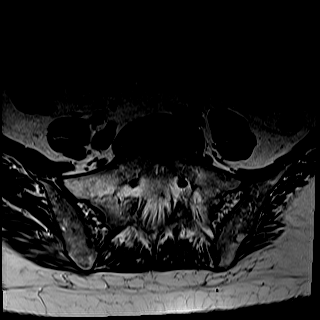
[im 14/40]
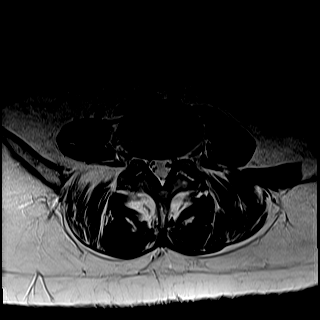
[im 18/40]
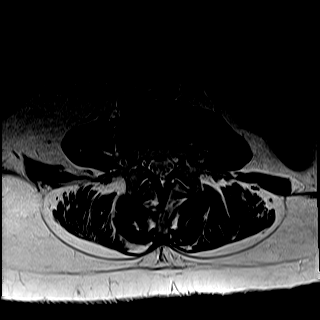
[im 22/40]
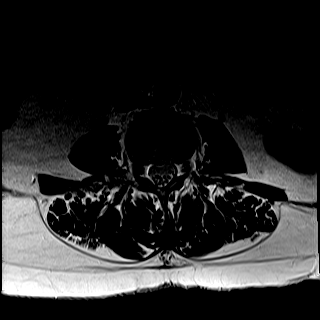
[im 27/40]
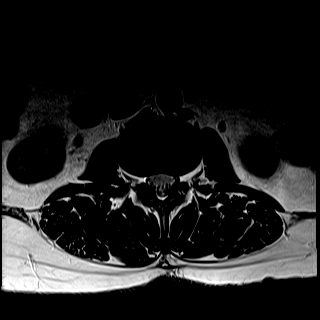
[im 35/40]
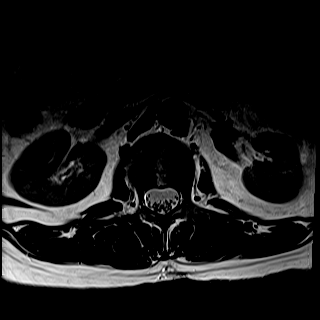
[im 40/40]
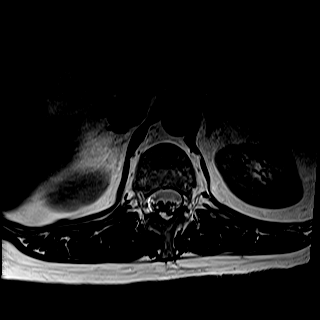

[Series 22: t1_axial_obl · axial · 3.0mm · 0.43mm/px · z∈[-574,-354]mm · 7 of 26 slices shown]
[im 1/26]
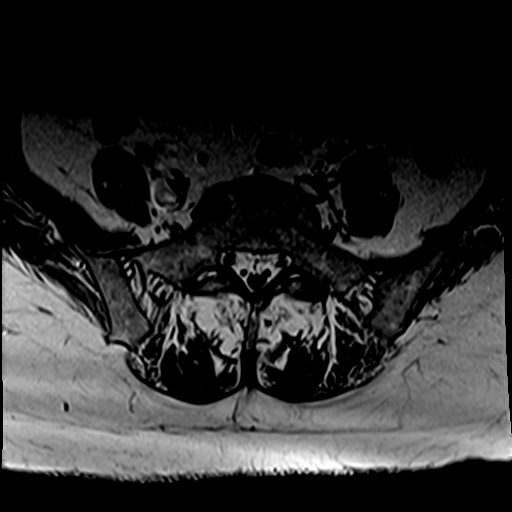
[im 5/26]
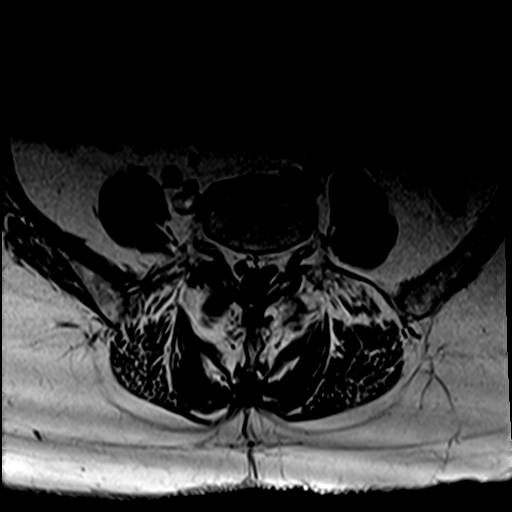
[im 9/26]
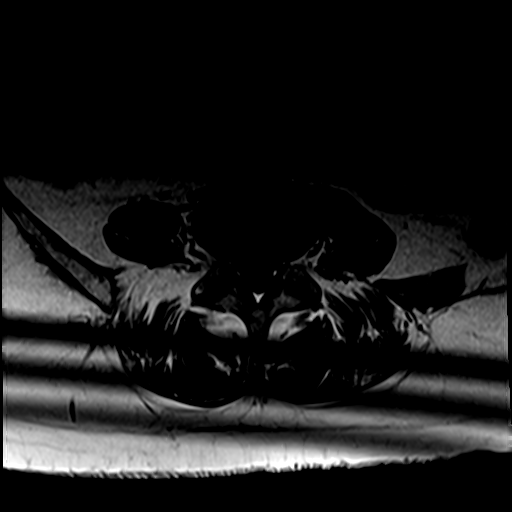
[im 13/26]
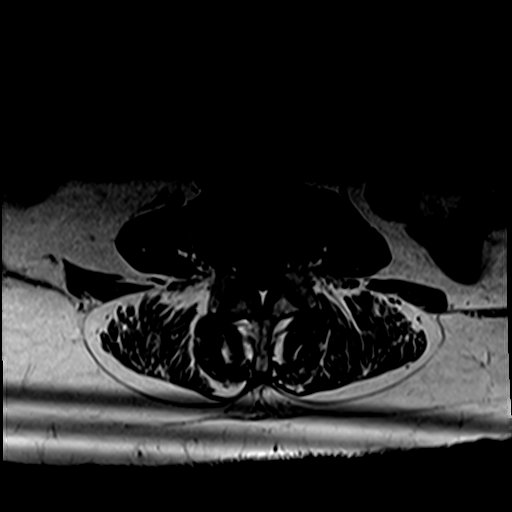
[im 17/26]
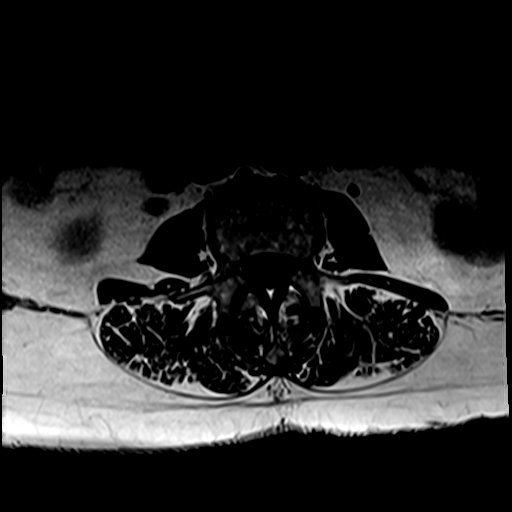
[im 21/26]
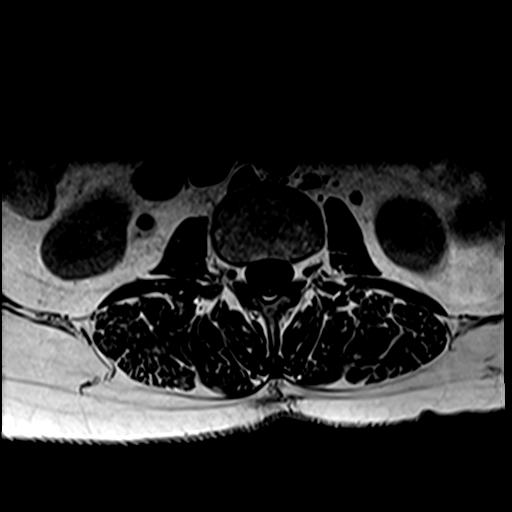
[im 26/26]
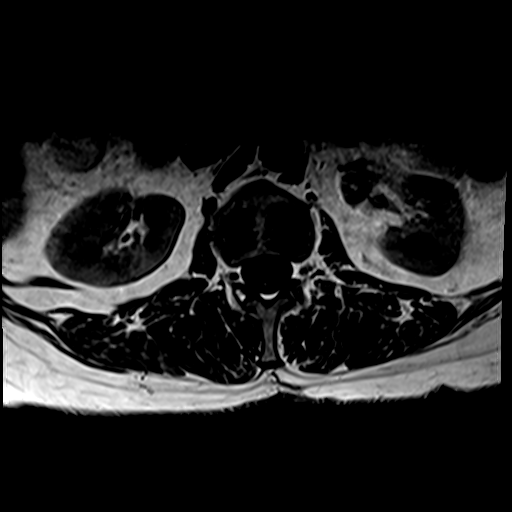

[42 of 48 positions shown; findings below may reference images not displayed]

FINDINGS: Conus: The conus is normal in appearance and position.  

Alignment: Mild retrolisthesis of L3-L4. Minimal retrolisthesis of L1-L2. 

Marrow: No acute fracture. No pars defect. Mild scattered discogenic endplate reactive marrow changes. Chronic compression fracture of L1, status post kyphoplasty. Large Schmorl's node at the superior endplate of L2 with mild chronic compression deformity.  

T12-L1: Mild disc bulge. No canal or foraminal stenosis.

L1-L2: Minimal retrolisthesis. Mild disc bulge. No canal or foraminal stenosis. No facet arthrosis.

L2-L3: Minimal disc bulge. No canal or foraminal stenosis.

L3-L4: Mild retrolisthesis. Moderate disc bulge. No facet arthrosis. Moderate canal stenosis. Mild bilateral foraminal stenosis.

L4-L5: Mild disc bulge. No facet arthrosis. Ligamentum flavum thickening. Mild epidural lipomatosis with mild thecal sac stenosis. No canal stenosis. Minimal bilateral foraminal stenosis.

L5-S1: Mild disc bulge. Epidural lipomatosis with mild thecal sac stenosis. Mild right lateral recess narrowing. No nerve impingement. Minimal facet arthrosis. Mild right foraminal stenosis.

Visualized SI joints: Intact

Visualized soft tissues: Unremarkable.

Incidental advanced cerebral atrophy.
IMPRESSION: 1.
Moderate disc bulge and mild retrolisthesis at L3-L4 with moderate canal and mild bilateral foraminal stenosis.

2.
Mild disc bulge and epidural lipomatosis at L4-S1 with mild thecal sac stenosis. Mild right foraminal stenosis at L5-S1.

3.
Moderate chronic compression fracture of L1, status post kyphoplasty.

4.
Mild chronic compression fracture of L2 with associated Schmorl's node.

## 2021-02-06 IMAGING — US BX THYROID FNA LT
1 series · 14 of 22 positions shown · non-contrast
Comparison: none

CLINICAL HISTORY: Referred for ultrasound-guided fine-needle aspiration of Nontoxic multinodular goiter, left thyroid nodule.

[Series 1: bx thyroid fna left · 14 of 22 slices shown]
[im 1/22]
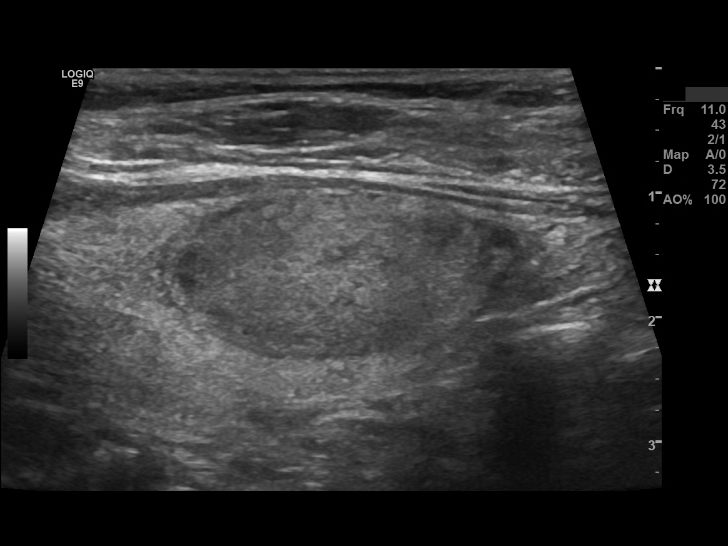
[im 3/22]
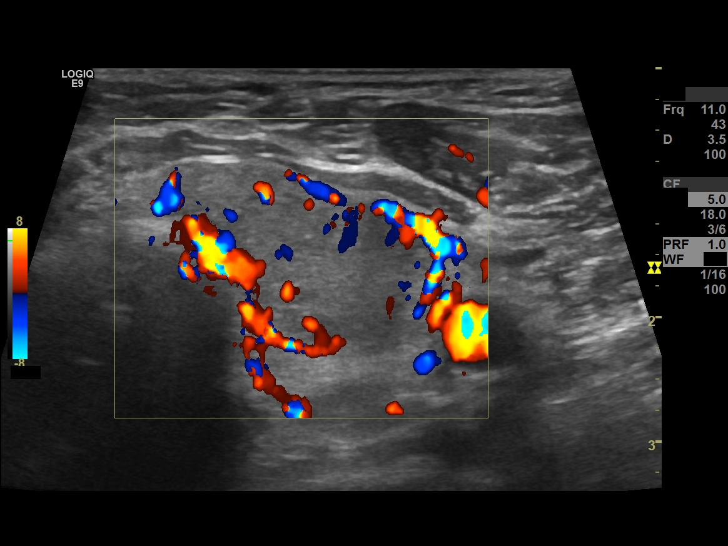
[im 4/22]
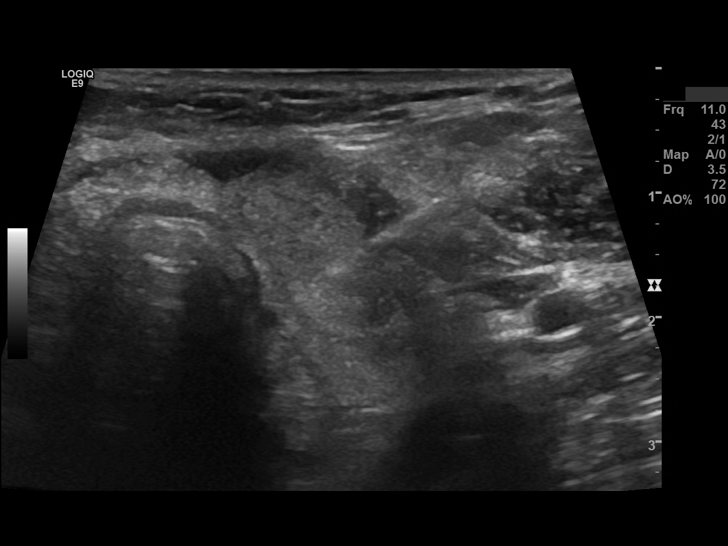
[im 6/22]
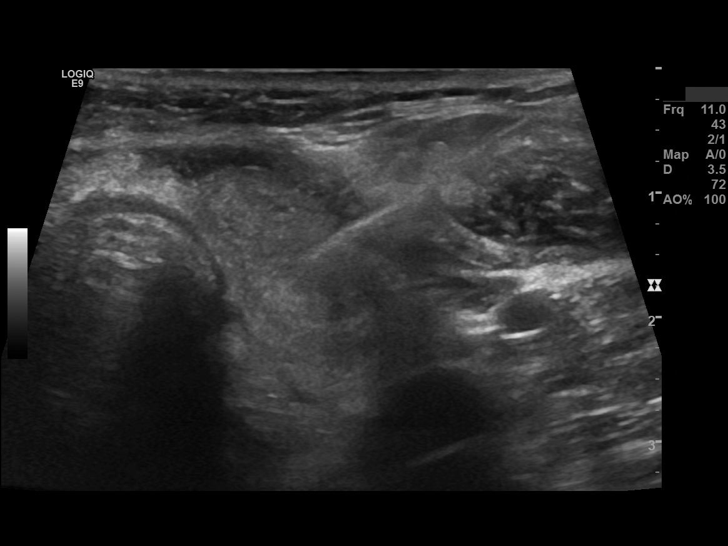
[im 8/22]
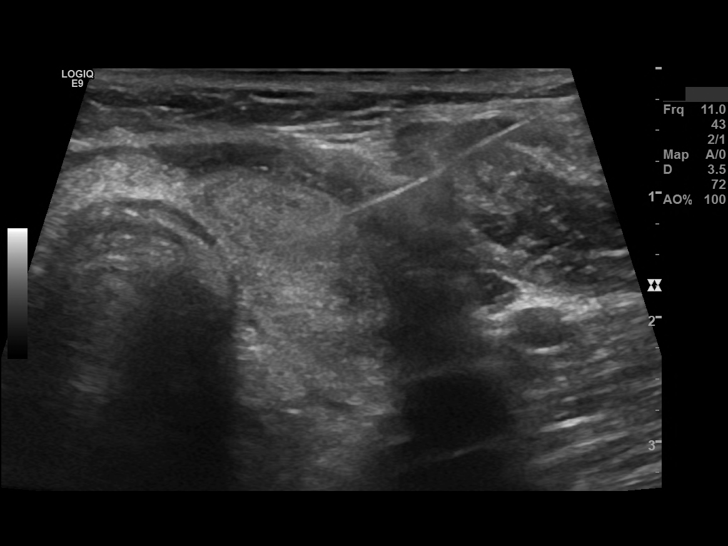
[im 9/22]
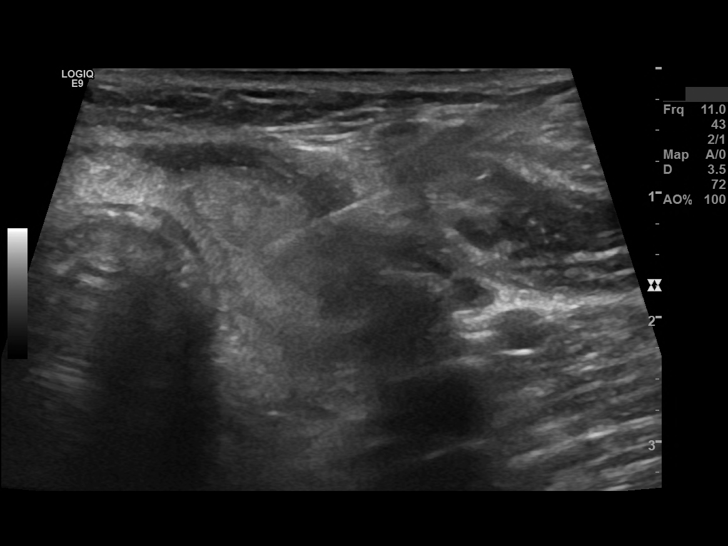
[im 11/22]
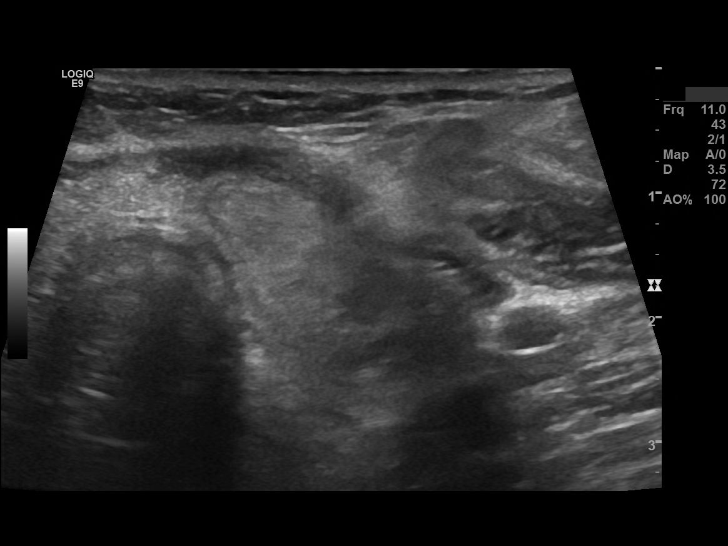
[im 12/22]
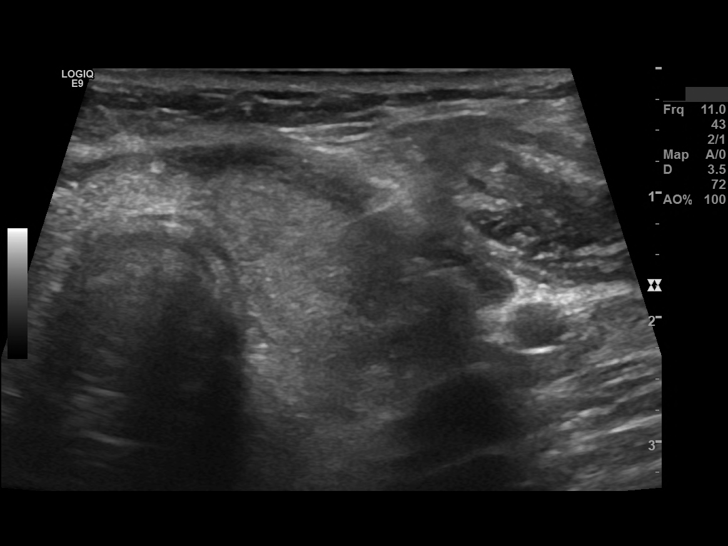
[im 14/22]
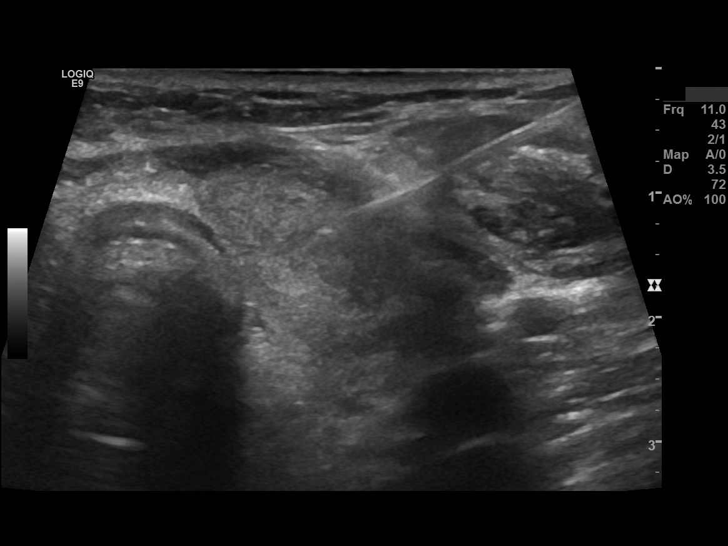
[im 15/22]
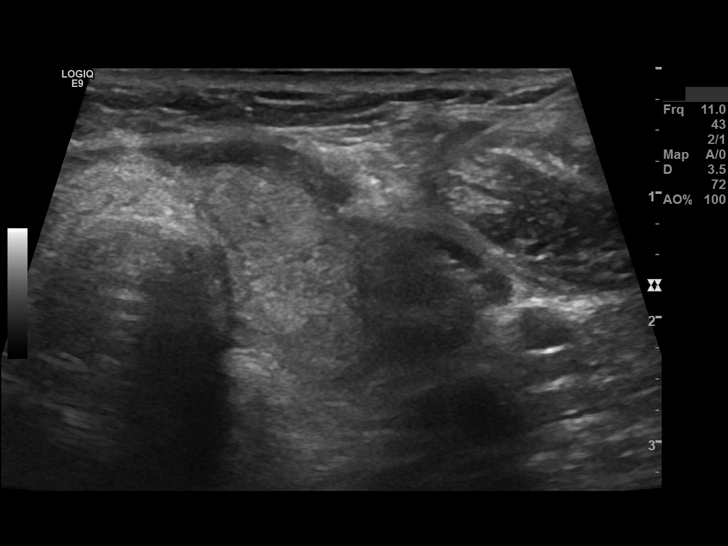
[im 17/22]
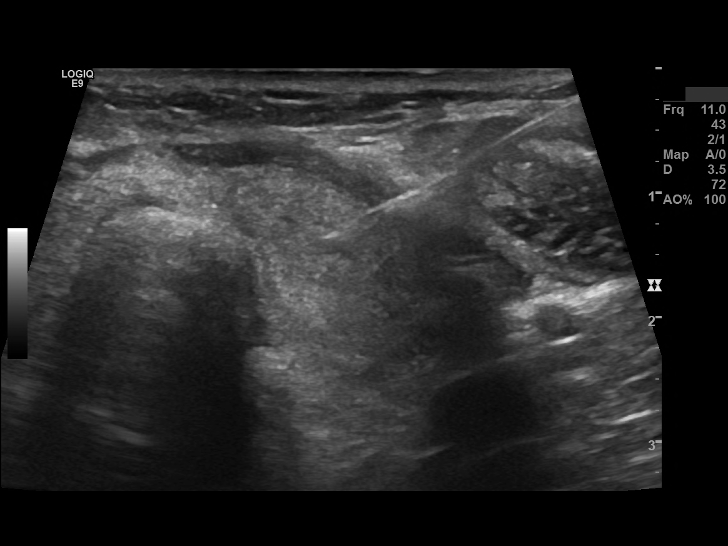
[im 19/22]
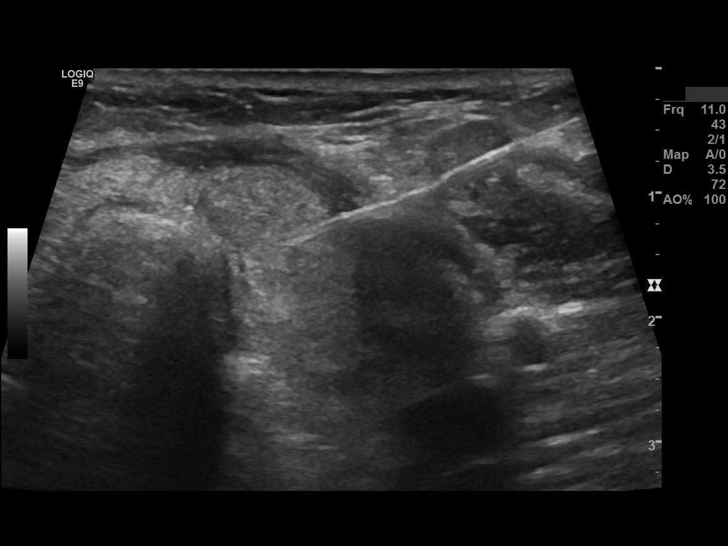
[im 20/22]
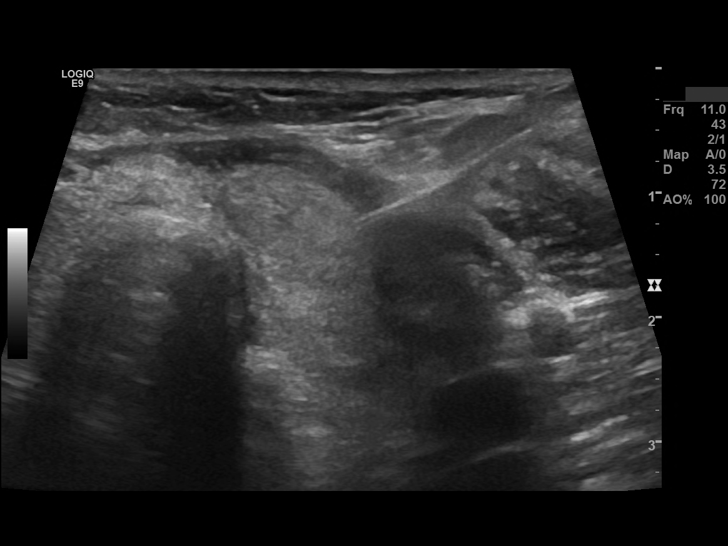
[im 22/22]
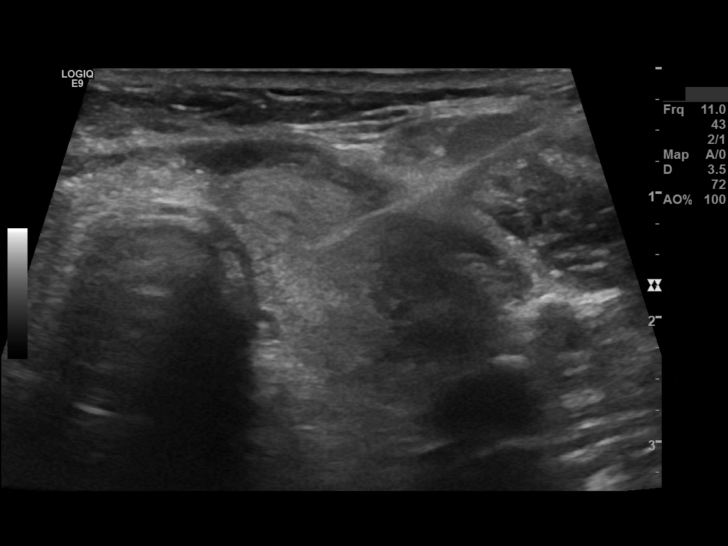

[14 of 22 positions shown; findings below may reference images not displayed]

TECHNIQUE AND FINDINGS: 

Informed written consent was obtained after the patient indicated understanding of the risks, benefits, and alternatives to the procedure.  

The 25 x 19 x 20 mm nodule in the left thyroid gland was visualized. The patient was prepped and draped in the usual sterile fashion.  The local area was anesthetized with 1% lidocaine.  Using ultrasound guidance, 5 passes with 25 and 23 gauge needles were obtained.  Smears were submitted to pathology in ethanol. Additional material was placed in formalin for cellblock.

The patient tolerated the procedure well without immediate complications.
IMPRESSION: Ultrasound-guided fine needle aspiration was performed of the 25 x 19 x 20 mm left lobe thyroid nodule.
